# Patient Record
Sex: Female | Born: 2000 | Race: White | Hispanic: No | Marital: Single | Smoking: Never smoker
Health system: Southern US, Community
[De-identification: ages and names within clinical notes are randomized; demographics above are authoritative.]

## PROBLEM LIST (undated history)

## (undated) DIAGNOSIS — IMO0001 Reserved for inherently not codable concepts without codable children: Secondary | ICD-10-CM

## (undated) HISTORY — PX: NO PAST SURGERIES: SHX2092

## (undated) HISTORY — DX: Reserved for inherently not codable concepts without codable children: IMO0001

---
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
## 2019-04-18 ENCOUNTER — Other Ambulatory Visit: Payer: BLUE CROSS/BLUE SHIELD

## 2019-04-25 ENCOUNTER — Encounter: Payer: Self-pay | Admitting: Nurse Practitioner

## 2019-04-25 ENCOUNTER — Encounter: Payer: BLUE CROSS/BLUE SHIELD | Admitting: Nurse Practitioner

## 2019-04-25 ENCOUNTER — Other Ambulatory Visit: Payer: Self-pay

## 2019-04-25 ENCOUNTER — Ambulatory Visit (INDEPENDENT_AMBULATORY_CARE_PROVIDER_SITE_OTHER): Payer: BLUE CROSS/BLUE SHIELD | Admitting: Nurse Practitioner

## 2019-04-25 VITALS — BP 108/63 | HR 92 | Ht 64.55 in | Wt 123.0 lb

## 2019-04-25 DIAGNOSIS — Z0000 Encounter for general adult medical examination without abnormal findings: Secondary | ICD-10-CM

## 2019-04-25 NOTE — Patient Instructions (Addendum)
Olivia Ortiz,   Thank you for coming in to clinic today.  1. Continue an active lifestyle  2. You will be due for [place].  This means you should eat no food or drink after midnight.  Drink only water or coffee without cream/sugar on the morning of your lab visit. - Please get your labs collected at a [place] for lab draw in the next 7 days. - Your results will be available about 2-3 days after blood draw.  If you have set up a MyChart account, you can can log in to MyChart online to view your results and a brief explanation. Also, we can discuss your results together at your next office visit if you would like.   Please schedule a follow-up appointment with Cassell Smiles, AGNP. Return in about 1 year (around 04/24/2020) for annual physical.  If you have any other questions or concerns, please feel free to call the clinic or send a message through [place]. You may also schedule an earlier appointment if necessary.  You will receive a survey after today's visit either digitally by e-mail or paper by [employer]. Your experiences and feedback matter to [place].  Please respond so we know how we are doing as we provide care for you.   Cassell Smiles, DNP, AGNP-BC Adult Gerontology Nurse Practitioner [place]

## 2019-04-25 NOTE — Progress Notes (Signed)
Subjective:    Patient ID: Olivia BachelorBriana Ballinger, female    DOB: 05-Jan-2001, 18 y.o.   MRN: [id number]  Olivia Ortiz is a 18 y.o. female presenting on 04/25/2019 for Annual Exam  HPI Annual Physical Exam Patient has been feeling well.  They have no acute concerns today. Sleeps 10-12 hours per night uninterrupted.  Patient was around 5-6 in past.  Now patient states she always feels tired.   HEALTH MAINTENANCE: Weight/BMI: healthy Physical activity: regular with work, swimming Diet: regular Seatbelt: always Sunscreen: regularly PAP: n/a HIV: neg Optometry: not regularly Dentistry: regularly  VACCINES: Tetanus: up to date Influenza: recommended  Depression screen [facility] 2/9 04/25/2019 04/19/2018  Decreased Interest 0 0  Down, Depressed, Hopeless 0 1  PHQ - 2 Score 0 1  Altered sleeping - 3  Tired, decreased energy - 2  Change in appetite - 0  Feeling bad or failure about yourself  - 0  Trouble concentrating - 0  Moving slowly or fidgety/restless - 0  Suicidal thoughts - 0  PHQ-9 Score - 6  Difficult doing work/chores - Not difficult at all     Past Medical History:  Diagnosis Date  . Healthy adolescent    Past Surgical History:  Procedure Laterality Date  . NO PAST SURGERIES     Social History   Socioeconomic History  . Marital status: Single    Spouse name: Not on file  . Number of children: Not on file  . Years of education: Not on file  . Highest education level: Not on file  Occupational History  . Occupation: Consulting civil engineerstudent  Social Needs  . Financial resource strain: Not on file  . Food insecurity    Worry: Not on file    Inability: Not on file  . Transportation needs    Medical: Not on file    Non-medical: Not on file  Tobacco Use  . Smoking status: Never Smoker  . Smokeless tobacco: Never Used  Substance and Sexual Activity  . Alcohol use: No    Frequency: Never  . Drug use: No  . Sexual activity: Not on file  Lifestyle  . Physical activity    Days per  week: Not on file    Minutes per session: Not on file  . Stress: Not on file  Relationships  . Social Musicianconnections    Talks on phone: Not on file    Gets together: Not on file    Attends religious service: Not on file    Active member of club or organization: Not on file    Attends meetings of clubs or organizations: Not on file    Relationship status: Not on file  . Intimate partner violence    Fear of current or ex partner: Not on file    Emotionally abused: Not on file    Physically abused: Not on file    Forced sexual activity: Not on file  Other Topics Concern  . Not on file  Social History Narrative  . Not on file   Family History  Problem Relation Age of Onset  . Healthy Mother   . Healthy Father   . Cancer Paternal Grandmother   . Breast cancer Paternal Grandmother   . Healthy Brother   . Diabetes Maternal Grandfather   . CAD Maternal Grandfather   . Cancer Paternal Grandfather        skin  . Stroke Neg Hx    Current Outpatient Medications on File Prior to Visit  Medication Sig  .  medroxyPROGESTERone (DEPO-PROVERA) 150 MG/ML injection Inject 1 mL (150 mg total) into the muscle every 3 (three) months. (Patient not taking: Reported on 04/25/2019)   No current facility-administered medications on file prior to visit.     Review of Systems  Constitutional: Negative for chills and fever.  HENT: Negative for congestion and sore throat.   Eyes: Negative for pain.  Respiratory: Negative for cough, shortness of breath and wheezing.   Cardiovascular: Negative for chest pain, palpitations and leg swelling.  Gastrointestinal: Negative for abdominal pain, blood in stool, constipation, diarrhea, nausea and vomiting.  Endocrine: Negative for polydipsia.  Genitourinary: Negative for dysuria, frequency, hematuria and urgency.  Musculoskeletal: Negative for back pain, myalgias and neck pain.  Skin: Negative for rash.  Allergic/Immunologic: Negative for environmental allergies.   Neurological: Negative for dizziness, weakness and headaches.  Hematological: Does not bruise/bleed easily.  Psychiatric/Behavioral: Negative for dysphoric mood and suicidal ideas. The patient is not nervous/anxious.    Per HPI unless specifically indicated above.     Objective:    BP 108/63 (BP Location: Right Arm, Patient Position: Sitting, Cuff Size: Small)   Pulse 92   Ht 5' 4.55" (1.64 m)   Wt 123 lb (55.8 kg)   LMP 04/04/2019   BMI 20.75 kg/m   Wt Readings from Last 3 Encounters:  04/25/19 123 lb (55.8 kg) (42 %, Z= -0.21)*  04/19/18 108 lb 12.8 oz (49.4 kg) (17 %, Z= -0.96)*  10/20/17 121 lb 6.4 oz (55.1 kg) (46 %, Z= -0.10)*   * Growth percentiles are based on CDC (Girls, 2-20 Years) data.    Physical Exam Vitals signs and nursing note reviewed.  Constitutional:      General: She is not in acute distress.    Appearance: Normal appearance. She is well-developed and normal weight.  HENT:     Head: Normocephalic and atraumatic.     Right Ear: Tympanic membrane, ear canal and external ear normal.     Left Ear: Tympanic membrane, ear canal and external ear normal.     Nose: Nose normal.     Mouth/Throat:     Mouth: Mucous membranes are moist.  Eyes:     Conjunctiva/sclera: Conjunctivae normal.     Pupils: Pupils are equal, round, and reactive to light.  Neck:     Musculoskeletal: Normal range of motion and neck supple.     Thyroid: No thyromegaly.     Vascular: No JVD.     Trachea: No tracheal deviation.  Cardiovascular:     Rate and Rhythm: Normal rate and regular rhythm.     Heart sounds: Normal heart sounds. No murmur. No friction rub. No gallop.   Pulmonary:     Effort: Pulmonary effort is normal. No respiratory distress.     Breath sounds: Normal breath sounds.  Abdominal:     General: Bowel sounds are normal. There is no distension.     Palpations: Abdomen is soft.     Tenderness: There is no abdominal tenderness.  Musculoskeletal: Normal range of  motion.  Lymphadenopathy:     Cervical: No cervical adenopathy.  Skin:    General: Skin is warm and dry.     Capillary Refill: Capillary refill takes less than 2 seconds.  Neurological:     General: No focal deficit present.     Mental Status: She is alert and oriented to person, place, and time. Mental status is at baseline.     Cranial Nerves: No cranial nerve deficit.  Psychiatric:  Mood and Affect: Mood normal.        Behavior: Behavior normal.        Thought Content: Thought content normal.        Judgment: Judgment normal.     Results for orders placed or performed in visit on 07/05/18  POCT urine pregnancy  Result Value Ref Range   Preg Test, Ur Negative Negative      Assessment & Plan:   Problem List Items Addressed This Visit    None    Visit Diagnoses    Encounter for annual physical exam    -  Primary   Relevant Orders   CBC with Differential/Platelet   TSH   Comprehensive Metabolic Panel (CMET)   Hemoglobin A1c     Annual physical exam without new findings.  Well adult with no acute concerns.  Not currently sexually active and not needing contraception discussion today.  Plan: 1. Obtain health maintenance screenings as above according to age. - Increase physical activity to 30 minutes most days of the week.  - Eat healthy diet high in vegetables and fruits; low in refined carbohydrates. - Screening labs and tests as ordered 2. Encouraged patient to seek contraception as soon as she may become sexually active, use barrier methods if not on hormonal method. 3. Return 1 year for annual physical.   Follow up plan: Return in about 1 year (around 04/24/2020) for annual physical.  Cassell Smiles, DNP, AGPCNP-BC Adult Gerontology Primary Care Nurse Practitioner [facility] 04/25/2019, 2:35 PM

## 2019-05-17 ENCOUNTER — Emergency Department
Admission: EM | Admit: 2019-05-17 | Discharge: 2019-05-17 | Disposition: A | Discharge status: Home / Self Care | Payer: No Typology Code available for payment source | Attending: Emergency Medicine | Admitting: Emergency Medicine

## 2019-05-17 ENCOUNTER — Emergency Department: Payer: No Typology Code available for payment source

## 2019-05-17 ENCOUNTER — Other Ambulatory Visit: Payer: Self-pay

## 2019-05-17 ENCOUNTER — Encounter: Payer: Self-pay | Admitting: Emergency Medicine

## 2019-05-17 DIAGNOSIS — R1011 Right upper quadrant pain: Secondary | ICD-10-CM | POA: Diagnosis present

## 2019-05-17 DIAGNOSIS — K8020 Calculus of gallbladder without cholecystitis without obstruction: Secondary | ICD-10-CM | POA: Diagnosis not present

## 2019-05-17 LAB — COMPREHENSIVE METABOLIC PANEL
ALT: 133 U/L — ABNORMAL HIGH (ref 0–44)
AST: 277 U/L — ABNORMAL HIGH (ref 15–41)
Albumin: 4.6 g/dL (ref 3.5–5.0)
Alkaline Phosphatase: 64 U/L (ref 38–126)
Anion gap: 9 (ref 5–15)
BUN: 9 mg/dL (ref 6–20)
CO2: 22 mmol/L (ref 22–32)
Calcium: 9.4 mg/dL (ref 8.9–10.3)
Chloride: 109 mmol/L (ref 98–111)
Creatinine, Ser: 0.56 mg/dL (ref 0.44–1.00)
GFR calc Af Amer: >60 mL/min (ref >60)
GFR calc non Af Amer: >60 mL/min (ref >60)
Glucose, Bld: 96 mg/dL (ref 70–99)
Potassium: 3.9 mmol/L (ref 3.5–5.1)
Sodium: 140 mmol/L (ref 135–145)
Total Bilirubin: 1.2 mg/dL (ref 0.3–1.2)
Total Protein: 7.3 g/dL (ref 6.5–8.1)

## 2019-05-17 LAB — CBC
HCT: 39.9 % (ref 36.0–46.0)
Hemoglobin: 13.7 g/dL (ref 12.0–15.0)
MCH: 29 pg (ref 26.0–34.0)
MCHC: 34.3 g/dL (ref 30.0–36.0)
MCV: 84.5 fL (ref 80.0–100.0)
Platelets: 264 10*3/uL (ref 150–400)
RBC: 4.72 MIL/uL (ref 3.87–5.11)
RDW: 13.4 % (ref 11.5–15.5)
WBC: 9.7 10*3/uL (ref 4.0–10.5)
nRBC: 0 % (ref 0.0–0.2)

## 2019-05-17 LAB — URINALYSIS, COMPLETE (UACMP) WITH MICROSCOPIC
Bilirubin Urine: NEGATIVE
Glucose, UA: NEGATIVE mg/dL
Hgb urine dipstick: NEGATIVE
Ketones, ur: NEGATIVE mg/dL
Leukocytes,Ua: NEGATIVE
Nitrite: NEGATIVE
Protein, ur: NEGATIVE mg/dL
Specific Gravity, Urine: 1.005 (ref 1.005–1.030)
pH: 6 (ref 5.0–8.0)

## 2019-05-17 LAB — ETHANOL: Alcohol, Ethyl (B): <10 mg/dL (ref <10)

## 2019-05-17 LAB — POCT PREGNANCY, URINE: Preg Test, Ur: NEGATIVE

## 2019-05-17 LAB — LIPASE, BLOOD: Lipase: 31 U/L (ref 11–51)

## 2019-05-17 MED ORDER — IBUPROFEN 600 MG PO TABS: 600.0000 mg | ORAL_TABLET | Freq: Three times a day (TID) | ORAL | 0 refills | Status: DC | PRN

## 2019-05-17 MED ORDER — ONDANSETRON HCL 4 MG/2ML IJ SOLN
4.0000 mg | Freq: Once | INTRAMUSCULAR | Status: AC
  Administered 2019-05-17: 4 mg via INTRAVENOUS
  Filled 2019-05-17: qty 2

## 2019-05-17 MED ORDER — MORPHINE SULFATE (PF) 4 MG/ML IV SOLN
4.0000 mg | Freq: Once | INTRAVENOUS | Status: AC
  Administered 2019-05-17: 4 mg via INTRAVENOUS
  Filled 2019-05-17: qty 1

## 2019-05-17 MED ORDER — HYDROCODONE-ACETAMINOPHEN 5-325 MG PO TABS
1.0000 | ORAL_TABLET | Freq: Once | ORAL | Status: AC
  Administered 2019-05-17: 1 via ORAL
  Filled 2019-05-17: qty 1

## 2019-05-17 MED ORDER — SODIUM CHLORIDE 0.9 % IV BOLUS
1000.0000 mL | Freq: Once | INTRAVENOUS | Status: AC
  Administered 2019-05-17: 13:00:00 1000 mL via INTRAVENOUS

## 2019-05-17 MED ORDER — OXYCODONE HCL 5 MG PO TABS: 5.0000 mg | ORAL_TABLET | Freq: Three times a day (TID) | ORAL | 0 refills | Status: DC | PRN

## 2019-05-17 MED ORDER — ONDANSETRON 4 MG PO TBDP: 4.0000 mg | ORAL_TABLET | Freq: Three times a day (TID) | ORAL | 0 refills | Status: DC | PRN

## 2019-05-17 NOTE — ED Triage Notes (Signed)
Pt reports intermittent pain in her RUQ since early this am and now with right shoulder and neck pain. Pt states pain in abd is intermittent but pain in neck and shoulder is constant. Pt denies NVD, SOB or urinary sx's.

## 2019-05-17 NOTE — ED Notes (Signed)
Pt d/c instructions reveiwed. Pt told to not operate a motor vehicle on prescription. Pt is ambulatory. Pt is leaving with mother.

## 2019-05-17 NOTE — ED Notes (Signed)
Returned from U/S

## 2019-05-17 NOTE — ED Provider Notes (Signed)
[facility] Emergency Department Provider Note  ____________________________________________   First MD Initiated Contact with Patient 05/17/19 1201     (approximate)  I have reviewed the triage vital signs and the nursing notes.   HISTORY  Chief Complaint Abdominal Pain, Torticollis, and Shoulder Pain    HPI Olivia Ortiz is a 18 y.o. female here with right upper quadrant abdominal pain.  The patient states she awoke around 2 AM this morning.  She states she developed acute, aching, gnawing, right upper quadrant abdominal pain.  She was able to go back to sleep but when she awoke and started moving, the pain radiated up towards her right upper quadrant and right shoulder blade.  She describes it as an aching, stabbing, sharp pain. No acute ischemic changes.  The pain is worse with eating and palpation.  No alleviating factors.  She had associated nausea but no vomiting.  No diarrhea.  No history of similar symptoms.  No cough.  No known fevers.  No urinary symptoms.  No h/o ovarian cysts.       Past Medical History:  Diagnosis Date  . Healthy adolescent     There are no active problems to display for this patient.   Past Surgical History:  Procedure Laterality Date  . NO PAST SURGERIES      Prior to Admission medications   Medication Sig Start Date End Date Taking? Authorizing Provider  ibuprofen (ADVIL) 600 MG tablet Take 1 tablet (600 mg total) by mouth every 8 (eight) hours as needed for moderate pain. 05/17/19   Duffy Bruce, MD  ondansetron (ZOFRAN ODT) 4 MG disintegrating tablet Take 1 tablet (4 mg total) by mouth every 8 (eight) hours as needed for nausea or vomiting. 05/17/19   Duffy Bruce, MD  oxyCODONE (ROXICODONE) 5 MG immediate release tablet Take 1 tablet (5 mg total) by mouth every 8 (eight) hours as needed for severe pain. 05/17/19 05/16/20  Duffy Bruce, MD    Allergies Patient has no known allergies.  Family History  Problem  Relation Age of Onset  . Healthy Mother   . Healthy Father   . Cancer Paternal Grandmother   . Breast cancer Paternal Grandmother   . Healthy Brother   . Diabetes Maternal Grandfather   . CAD Maternal Grandfather   . Cancer Paternal Grandfather        skin  . Stroke Neg Hx     Social History Social History   Tobacco Use  . Smoking status: Never Smoker  . Smokeless tobacco: Never Used  Substance Use Topics  . Alcohol use: No    Frequency: Never  . Drug use: No    Review of Systems  Review of Systems  Constitutional: Positive for fatigue. Negative for fever.  HENT: Negative for congestion and sore throat.   Eyes: Negative for visual disturbance.  Respiratory: Negative for cough and shortness of breath.   Cardiovascular: Negative for chest pain.  Gastrointestinal: Positive for abdominal pain and nausea. Negative for diarrhea and vomiting.  Genitourinary: Negative for flank pain.  Musculoskeletal: Negative for back pain and neck pain.  Skin: Negative for rash and wound.  Neurological: Negative for weakness.  All other systems reviewed and are negative.    ____________________________________________  PHYSICAL EXAM:      VITAL SIGNS: ED Triage Vitals  Enc Vitals Group     BP 05/17/19 1145 106/70     Pulse --      Resp --  Temp --      Temp src --      SpO2 --      Weight 05/17/19 1043 120 lb (54.4 kg)     Height 05/17/19 1043 5\' 4"  (1.626 m)     Head Circumference --      Peak Flow --      Pain Score 05/17/19 1043 7     Pain Loc --      Pain Edu? --      Excl. in GC? --      Physical Exam Vitals signs and nursing note reviewed.  Constitutional:      General: She is not in acute distress.    Appearance: She is well-developed.  HENT:     Head: Normocephalic and atraumatic.  Eyes:     Conjunctiva/sclera: Conjunctivae normal.  Neck:     Musculoskeletal: Neck supple.  Cardiovascular:     Rate and Rhythm: Normal rate and regular rhythm.     Heart  sounds: Normal heart sounds. No murmur. No friction rub.  Pulmonary:     Effort: Pulmonary effort is normal. No respiratory distress.     Breath sounds: Normal breath sounds. No wheezing or rales.  Abdominal:     General: There is no distension.     Palpations: Abdomen is soft.     Tenderness: There is abdominal tenderness in the right upper quadrant and epigastric area. There is guarding. There is no right CVA tenderness, left CVA tenderness or rebound. Positive signs include Murphy's sign. Negative signs include Rovsing's sign.  Skin:    General: Skin is warm.     Capillary Refill: Capillary refill takes less than 2 seconds.  Neurological:     Mental Status: She is alert and oriented to person, place, and time.     Motor: No abnormal muscle tone.       ____________________________________________   LABS (all labs ordered are listed, but only abnormal results are displayed)  Labs Reviewed  COMPREHENSIVE METABOLIC PANEL - Abnormal; Notable for the following components:      Result Value   AST 277 (*)    ALT 133 (*)    All other components within normal limits  URINALYSIS, COMPLETE (UACMP) WITH MICROSCOPIC - Abnormal; Notable for the following components:   Color, Urine STRAW (*)    APPearance CLEAR (*)    Bacteria, UA RARE (*)    All other components within normal limits  LIPASE, BLOOD  CBC  ETHANOL  POC URINE PREG, ED  POCT PREGNANCY, URINE    ____________________________________________  EKG: None ________________________________________  RADIOLOGY All imaging, including plain films, CT scans, and ultrasounds, independently reviewed by me, and interpretations confirmed via formal radiology reads.  ED MD interpretation:   U/S: Gallstones, no acute cholecystitis  Official radiology report(s): US Abdomen Limited Ruq  Result Date: 05/17/2019 CLINICAL DATA:  Right upper quadrant pain for 1 day EXAM: ULTRASOUND ABDOMEN LIMITED RIGHT UPPER QUADRANT COMPARISON:  None.  FINDINGS: Gallbladder: There are several small gallstones measuring up to 6 mm. No gallbladder wall thickening. No pericholecystic fluid. No sonographic Murphy sign noted by sonographer. Common bile duct: Diameter: 2.0 mm Liver: No focal lesion identified. Within normal limits in parenchymal echogenicity. Portal vein is patent on color Doppler imaging with normal direction of blood flow towards the liver. Other: None. IMPRESSION: Cholelithiasis without other evidence of cholecystitis. Electronically Signed   By: Emmaline Kluver M.D.   On: 05/17/2019 13:29    ____________________________________________  PROCEDURES  Procedure(s) performed (including Critical Care):  Procedures  ____________________________________________  INITIAL IMPRESSION / MDM / ASSESSMENT AND PLAN / ED COURSE  As part of my medical decision making, I reviewed the following data within the electronic MEDICAL RECORD NUMBER Notes from prior ED visits and [place] Controlled Substance Database      *Abelina BachelorBriana Fullard was evaluated in Emergency Department on 05/17/2019 for the symptoms described in the history of present illness. She was evaluated in the context of the global COVID-19 pandemic, which necessitated consideration that the patient might be at risk for infection with the SARS-CoV-2 virus that causes COVID-19. Institutional protocols and algorithms that pertain to the evaluation of patients at risk for COVID-19 are in a state of rapid change based on information released by regulatory bodies including the CDC and federal and state organizations. These policies and algorithms were followed during the patient's care in the ED.  Some ED evaluations and interventions may be delayed as a result of limited staffing during the pandemic.*   Clinical Course as of May 17 1547  Tue May 17, 2019  1349 18 yo F here with RUQ pain. Suspect symptomatic choledocholithiasis versus early cholecystitis. Normal WBC, no signs of significant systemic  illness or infection. AST/ALT elevated but bili is WNL. Pain improving. U/S is c/w acute chole. Lipase normal, CBD normal, no signs of cholangitis or choledocholithiasis. UPT pending, Dr. Tonna BoehringerSakai consulted.   [CI]  1430 Dr. Tonna BoehringerSakai has seen. Pt pain markedly improved. Will PO challenge, d/c with outpt follow-up and good return precautions.   [CI]    Clinical Course User Index [CI] Shaune PollackIsaacs, Sorrel Cassetta, MD    Medical Decision Making: As above  ____________________________________________  FINAL CLINICAL IMPRESSION(S) / ED DIAGNOSES  Final diagnoses:  RUQ pain  Gallstones     MEDICATIONS GIVEN DURING THIS VISIT:  Medications  morphine 4 MG/ML injection 4 mg (4 mg Intravenous Given 05/17/19 1252)  ondansetron (ZOFRAN) injection 4 mg (4 mg Intravenous Given 05/17/19 1252)  sodium chloride 0.9 % bolus 1,000 mL (0 mLs Intravenous Stopped 05/17/19 1441)  HYDROcodone-acetaminophen (NORCO/VICODIN) 5-325 MG per tablet 1 tablet (1 tablet Oral Given 05/17/19 1446)     ED Discharge Orders         Ordered    oxyCODONE (ROXICODONE) 5 MG immediate release tablet  Every 8 hours PRN     05/17/19 1433    ibuprofen (ADVIL) 600 MG tablet  Every 8 hours PRN     05/17/19 1433    ondansetron (ZOFRAN ODT) 4 MG disintegrating tablet  Every 8 hours PRN     05/17/19 1433           Note:  This document was prepared using Dragon voice recognition software and may include unintentional dictation errors.   Shaune PollackIsaacs, Elgie Maziarz, MD 05/17/19 [phone]

## 2019-05-17 NOTE — ED Notes (Signed)
Pt states pain started have RUQ pain during the night that radiated to her shoulder. Side lying makes stomach pain worse. Pt can flex and extend neck w/o pain getting worse. Denies alcohol consumption. Pt states she has felt nauseas but denies vomiting. Pt states pain is 4/10.

## 2019-05-17 NOTE — Consult Note (Signed)
Subjective:   CC: Biliary colic, elevated LFTs  HPI:  Olivia Ortiz is a 18 y.o. female who is consulted by Ellender Hose for evaluation of above cc.  Symptoms were first noted 1 days ago. Pain is sharp, localized to the right upper quadrant, nonradiating.  Associated with nothing specific, exacerbated by nothing specific.  Work-up in the ED noted elevated LFTs, as well as cholelithiasis without any signs of acute cholecystitis.  Currently patient states the pain has much improved since admission. Past Medical History: None reported  Past Surgical History: None reported  Family History: family history includes Breast cancer in her paternal grandmother; CAD in her maternal grandfather; Cancer in her paternal grandfather and paternal grandmother; Diabetes in her maternal grandfather; Healthy in her brother, father, and mother.  Social History:  reports that she has never smoked. She has never used smokeless tobacco. She reports that she does not drink alcohol or use drugs.  Current Medications: None reported  Allergies:  Allergies as of 05/17/2019  . (No Known Allergies)    ROS:  General: Denies weight loss, weight gain, fatigue, fevers, chills, and night sweats. Eyes: Denies blurry vision, double vision, eye pain, itchy eyes, and tearing. Ears: Denies hearing loss, earache, and ringing in ears. Nose: Denies sinus pain, congestion, infections, runny nose, and nosebleeds. Mouth/throat: Denies hoarseness, sore throat, bleeding gums, and difficulty swallowing. Heart: Denies chest pain, palpitations, racing heart, irregular heartbeat, leg pain or swelling, and decreased activity tolerance. Respiratory: Denies breathing difficulty, shortness of breath, wheezing, cough, and sputum. GI: Denies change in appetite, heartburn, nausea, vomiting, constipation, diarrhea, and blood in stool. GU: Denies difficulty urinating, pain with urinating, urgency, frequency, blood in urine. Musculoskeletal: Denies  joint stiffness, pain, swelling, muscle weakness. Skin: Denies rash, itching, mass, tumors, sores, and boils Neurologic: Denies headache, fainting, dizziness, seizures, numbness, and tingling. Psychiatric: Denies depression, anxiety, difficulty sleeping, and memory loss. Endocrine: Denies heat or cold intolerance, and increased thirst or urination. Blood/lymph: Denies easy bruising, easy bruising, and swollen glands     Objective:     BP (!) 100/44 (BP Location: Right Arm)   Pulse 80   Temp 98.1 F (36.7 C) (Oral)   Resp 16   Ht 5\' 4"  (1.626 m)   Wt 54.4 kg   SpO2 100%   BMI 20.60 kg/m    Constitutional :  alert, cooperative, appears stated age and no distress  Lymphatics/Throat:  no asymmetry, masses, or scars  Respiratory:  clear to auscultation bilaterally  Cardiovascular:  regular rate and rhythm  Gastrointestinal: soft, non-tender; bowel sounds normal; no masses,  no organomegaly.   Musculoskeletal: Steady movement  Skin: Cool and moist  Psychiatric: Normal affect, non-agitated, not confused       LABS:  CMP Latest Ref Rng & Units 05/17/2019 04/20/2018  Glucose 70 - 99 mg/dL 96 94  BUN 6 - 20 mg/dL 9 9  Creatinine 0.44 - 1.00 mg/dL 0.56 0.88  Sodium 135 - 145 mmol/L 140 139  Potassium 3.5 - 5.1 mmol/L 3.9 4.2  Chloride 98 - 111 mmol/L 109 107  CO2 22 - 32 mmol/L 22 24  Calcium 8.9 - 10.3 mg/dL 9.4 9.4  Total Protein 6.5 - 8.1 g/dL 7.3 6.5  Total Bilirubin 0.3 - 1.2 mg/dL 1.2 0.5  Alkaline Phos 38 - 126 U/L 64 -  AST 15 - 41 U/L 277(H) 11(L)  ALT 0 - 44 U/L 133(H) 6   CBC Latest Ref Rng & Units 05/17/2019 04/20/2018  WBC 4.0 - 10.5  K/uL 9.7 7.9  Hemoglobin 12.0 - 15.0 g/dL 16.113.7 09.613.7  Hematocrit 04.536.0 - 46.0 % 39.9 42.0  Platelets 150 - 400 K/uL 264 244     RADS: CLINICAL DATA:  Right upper quadrant pain for 1 day  EXAM: ULTRASOUND ABDOMEN LIMITED RIGHT UPPER QUADRANT  COMPARISON:  None.  FINDINGS: Gallbladder:  There are several small gallstones  measuring up to 6 mm. No gallbladder wall thickening. No pericholecystic fluid. No sonographic Murphy sign noted by sonographer.  Common bile duct:  Diameter: 2.0 mm  Liver:  No focal lesion identified. Within normal limits in parenchymal echogenicity. Portal vein is patent on color Doppler imaging with normal direction of blood flow towards the liver.  Other: None.  IMPRESSION: Cholelithiasis without other evidence of cholecystitis. Assessment:      Cholelithiasis, elevated LFTs likely to episode of biliary colic.  Patient symptoms have improved and she shows no sign of acute cholecystitis on imaging with a normal WBC.  Plan:     Patient is requesting to go home at this time due to an upcoming vacation.  With no signs of acute cholecystitis on any objective findings and with the pain improving at this time, patient is deemed appropriate for discharge and close outpatient follow-up.  Strict ED return precautions given and dietary restrictions discussed to minimize another biliary colic prior to return home from her vacation.  If she does not have any further issues while on vacation, I asked her to do to make an appointment with our clinic to ensure that she has appropriate follow-up for eventual lap chole, which she is agreeable to.  We will discuss the details of the risk benefits alternatives to procedure at her next follow-up appointment.  Patient verbalized understanding and she is agreement with plan along with the ED provider.

## 2019-05-17 NOTE — ED Notes (Signed)
Pt highly encouraged for urine sample.

## 2019-06-01 ENCOUNTER — Ambulatory Visit: Payer: Self-pay | Admitting: Surgery

## 2019-06-01 NOTE — H&P (View-Only) (Signed)
Subjective:   CC: Biliary colic [K80.50]  HPI:  Olivia Ortiz is a 18 y.o. female who returns for evaluation of above CC. Nightly pain continues, but manageable with meds just at night.  She is able to keep some food down.   Past Medical History: none reported  Past Surgical History: none reported  Family History: reviewed and not relevant to CC  Social History:  reports that she has never smoked. She has never used smokeless tobacco. No history on file for alcohol and drug.  Current Medications: has a current medication list which includes the following prescription(s): codeine-guaifenesin, ibuprofen, ondansetron, and oxycodone.  Allergies:  Allergies as of 06/01/2019  . (No Known Allergies)    ROS:  A 15 point review of systems was performed and pertinent positives and negatives noted in HPI    Objective:     BP 115/69   Pulse 104   Ht 162.6 cm (5' 4")   Wt 60.3 kg (133 lb)   BMI 22.83 kg/m    Constitutional :  alert, appears stated age, cooperative and no distress  Lymphatics/Throat:  no asymmetry, masses, or scars  Respiratory:  clear to auscultation bilaterally  Cardiovascular:  regular rate and rhythm  Gastrointestinal: soft, non-tender; bowel sounds normal; no masses,  no organomegaly.    Musculoskeletal: Steady gait and movement  Skin: Cool and moist  Psychiatric: Normal affect, non-agitated, not confused       LABS:  Component Name 05/17/2019 04/20/2018   96 94  9 9   0.88   96   111   NOT APPLICABLE  140 139  3.9 4.2  109 107  22 24  9.4 9.4  7.3 6.5   2.3  1.2 0.5  277 (H) 11 (L)  133 (H) 6  31   0.56   4.6   64   >60   >60   9   Glucose, Bld  BUN  Creat  GFR, Est Non African American  GFR, Est African American  BUN/Creatinine Ratio  Sodium  Potassium  Chloride  CO2  Calcium  Total Protein  AG Ratio  Total Bilirubin  AST  ALT  Lipase  Creatinine, Ser  Albumin  Alkaline Phosphatase  GFR calc non Af Amer   GFR calc Af Amer  Anion gap      RADS: CLINICAL DATA: Right upper quadrant pain for 1 day  EXAM: ULTRASOUND ABDOMEN LIMITED RIGHT UPPER QUADRANT  COMPARISON: None.  FINDINGS: Gallbladder:  There are several small gallstones measuring up to 6 mm. No gallbladder wall thickening. No pericholecystic fluid. No sonographic Murphy sign noted by sonographer.  Common bile duct:  Diameter: 2.0 mm  Liver:  No focal lesion identified. Within normal limits in parenchymal echogenicity. Portal vein is patent on color Doppler imaging with normal direction of blood flow towards the liver.  Other: None.  IMPRESSION: Cholelithiasis without other evidence of cholecystitis.   Electronically Signed  By: Nancy Ballantyne M.D.  On: 05/17/2019 13:29  Other Result Information  Interface, Rad Results In - 05/17/2019  1:32 PM EDT CLINICAL DATA:  Right upper quadrant pain for 1 day  EXAM: ULTRASOUND ABDOMEN LIMITED RIGHT UPPER QUADRANT  COMPARISON:  None.  FINDINGS: Gallbladder:  There are several small gallstones measuring up to 6 mm. No gallbladder wall thickening. No pericholecystic fluid. No sonographic Murphy sign noted by sonographer.  Common bile duct:  Diameter: 2.0 mm  Liver:  No focal lesion identified. Within normal limits in parenchymal echogenicity. Portal vein   is patent on color Doppler imaging with normal direction of blood flow towards the liver.  Other: None.  IMPRESSION: Cholelithiasis without other evidence of cholecystitis.   Electronically Signed   By: Audie Pinto M.D.   On: 05/17/2019 13:29  Status    Assessment:      Biliary colic [G31.51]  Plan:     1. Biliary colic [V61.60].  Due to persistent pain at night, will proceed with lap chole, robot assisted.  Discussed the risk of surgery including post-op infxn, seroma, biloma, chronic pain, poor-delayed wound healing, retained gallstone, conversion to open procedure, post-op  SBO or ileus, and need for additional procedures to address said risks.  The risks of general anesthetic including MI, CVA, sudden death or even reaction to anesthetic medications also discussed. Alternatives include continued observation.  Benefits include possible symptom relief, prevention of complications including acute cholecystitis, pancreatitis.  Typical post operative recovery of 3-5 days rest, continued pain in area and incision sites, possible loose stools up to 4-6 weeks, also discussed.  ED return precautions given for sudden increase in RUQ pain, with possible accompanying fever, nausea, and/or vomiting.  The patient and mohter understands the risks, any and all questions were answered to the patient's satisfaction.  2. Patient has elected to proceed with surgical treatment. Procedure will be scheduled.  Written consent was obtained.

## 2019-06-01 NOTE — H&P (Signed)
Subjective:   CC: Biliary colic [K80.50]  HPI:  Olivia Ortiz is a 18 y.o. female who returns for evaluation of above CC. Nightly pain continues, but manageable with meds just at night.  She is able to keep some food down.   Past Medical History: none reported  Past Surgical History: none reported  Family History: reviewed and not relevant to CC  Social History:  reports that she has never smoked. She has never used smokeless tobacco. No history on file for alcohol and drug.  Current Medications: has a current medication list which includes the following prescription(s): codeine-guaifenesin, ibuprofen, ondansetron, and oxycodone.  Allergies:  Allergies as of 06/01/2019  . (No Known Allergies)    ROS:  A 15 point review of systems was performed and pertinent positives and negatives noted in HPI    Objective:     BP 115/69   Pulse 104   Ht 162.6 cm (5\' 4" )   Wt 60.3 kg (133 lb)   BMI 22.83 kg/m    Constitutional :  alert, appears stated age, cooperative and no distress  Lymphatics/Throat:  no asymmetry, masses, or scars  Respiratory:  clear to auscultation bilaterally  Cardiovascular:  regular rate and rhythm  Gastrointestinal: soft, non-tender; bowel sounds normal; no masses,  no organomegaly.    Musculoskeletal: Steady gait and movement  Skin: Cool and moist  Psychiatric: Normal affect, non-agitated, not confused       LABS:  Component Name 05/17/2019 04/20/2018   96 94  9 9   0.88   96   111   NOT APPLICABLE  140 139  3.9 4.2  109 107  22 24  9.4 9.4  7.3 6.5   2.3  1.2 0.5  277 (H) 11 (L)  133 (H) 6  31   0.56   4.6   64   >60   >60   9   Glucose, Bld  BUN  Creat  GFR, Est Non African American  GFR, Est African American  BUN/Creatinine Ratio  Sodium  Potassium  Chloride  CO2  Calcium  Total Protein  AG Ratio  Total Bilirubin  AST  ALT  Lipase  Creatinine, Ser  Albumin  Alkaline Phosphatase  GFR calc non Af Amer   GFR calc Af Amer  Anion gap      RADS: CLINICAL DATA: Right upper quadrant pain for 1 day  EXAM: ULTRASOUND ABDOMEN LIMITED RIGHT UPPER QUADRANT  COMPARISON: None.  FINDINGS: Gallbladder:  There are several small gallstones measuring up to 6 mm. No gallbladder wall thickening. No pericholecystic fluid. No sonographic Murphy sign noted by sonographer.  Common bile duct:  Diameter: 2.0 mm  Liver:  No focal lesion identified. Within normal limits in parenchymal echogenicity. Portal vein is patent on color Doppler imaging with normal direction of blood flow towards the liver.  Other: None.  IMPRESSION: Cholelithiasis without other evidence of cholecystitis.   Electronically Signed  By: Emmaline KluverNancy Ballantyne M.D.  On: 05/17/2019 13:29  Other Result Information  Interface, Rad Results In - 05/17/2019  1:32 PM EDT CLINICAL DATA:  Right upper quadrant pain for 1 day  EXAM: ULTRASOUND ABDOMEN LIMITED RIGHT UPPER QUADRANT  COMPARISON:  None.  FINDINGS: Gallbladder:  There are several small gallstones measuring up to 6 mm. No gallbladder wall thickening. No pericholecystic fluid. No sonographic Murphy sign noted by sonographer.  Common bile duct:  Diameter: 2.0 mm  Liver:  No focal lesion identified. Within normal limits in parenchymal echogenicity. Portal vein  is patent on color Doppler imaging with normal direction of blood flow towards the liver.  Other: None.  IMPRESSION: Cholelithiasis without other evidence of cholecystitis.   Electronically Signed   By: Audie Pinto M.D.   On: 05/17/2019 13:29  Status    Assessment:      Biliary colic [G31.51]  Plan:     1. Biliary colic [V61.60].  Due to persistent pain at night, will proceed with lap chole, robot assisted.  Discussed the risk of surgery including post-op infxn, seroma, biloma, chronic pain, poor-delayed wound healing, retained gallstone, conversion to open procedure, post-op  SBO or ileus, and need for additional procedures to address said risks.  The risks of general anesthetic including MI, CVA, sudden death or even reaction to anesthetic medications also discussed. Alternatives include continued observation.  Benefits include possible symptom relief, prevention of complications including acute cholecystitis, pancreatitis.  Typical post operative recovery of 3-5 days rest, continued pain in area and incision sites, possible loose stools up to 4-6 weeks, also discussed.  ED return precautions given for sudden increase in RUQ pain, with possible accompanying fever, nausea, and/or vomiting.  The patient and mohter understands the risks, any and all questions were answered to the patient's satisfaction.  2. Patient has elected to proceed with surgical treatment. Procedure will be scheduled.  Written consent was obtained.

## 2019-06-02 ENCOUNTER — Other Ambulatory Visit: Payer: Self-pay

## 2019-06-02 ENCOUNTER — Other Ambulatory Visit
Admission: RE | Admit: 2019-06-02 | Discharge: 2019-06-02 | Disposition: A | Discharge status: Home / Self Care | Payer: No Typology Code available for payment source | Source: Ambulatory Visit | Attending: Surgery | Admitting: Surgery

## 2019-06-02 ENCOUNTER — Inpatient Hospital Stay: Admission: RE | Admit: 2019-06-02 | Payer: No Typology Code available for payment source | Source: Ambulatory Visit

## 2019-06-02 DIAGNOSIS — Z01812 Encounter for preprocedural laboratory examination: Secondary | ICD-10-CM | POA: Diagnosis present

## 2019-06-02 DIAGNOSIS — Z20828 Contact with and (suspected) exposure to other viral communicable diseases: Secondary | ICD-10-CM | POA: Diagnosis not present

## 2019-06-02 LAB — SARS CORONAVIRUS 2 (TAT 6-24 HRS): SARS Coronavirus 2: NEGATIVE

## 2019-06-03 ENCOUNTER — Encounter
Admission: RE | Admit: 2019-06-03 | Discharge: 2019-06-03 | Disposition: A | Discharge status: Home / Self Care | Payer: No Typology Code available for payment source | Source: Ambulatory Visit | Attending: Surgery | Admitting: Surgery

## 2019-06-03 ENCOUNTER — Other Ambulatory Visit: Payer: Self-pay

## 2019-06-03 NOTE — Patient Instructions (Signed)
Your procedure is scheduled on: June 06, 2019 Report to Day Surgery on the 2nd floor of the [facility]. AT 11:30 AM    REMEMBER: Instructions that are not followed completely may result in serious medical risk, up to and including death; or upon the discretion of your surgeon and anesthesiologist your surgery may need to be rescheduled.  Do not eat food after midnight the night before surgery.  No gum chewing, lozengers or hard candies.  You may however, drink CLEAR liquids up to 2 hours before you are scheduled to arrive for your surgery. Do not drink anything within 2 hours of the start of your surgery.  Clear liquids include: - water  - apple juice without pulp -CLEAR  gatorade - black coffee or tea (Do NOT add milk or creamers to the coffee or tea) Do NOT drink anything that is not on this list.  No Alcohol for 24 hours before or after surgery.  No Smoking including e-cigarettes for 24 hours prior to surgery.  No chewable tobacco products for at least 6 hours prior to surgery.  No nicotine patches on the day of surgery.  On the morning of surgery brush your teeth with toothpaste and water, you may rinse your mouth with mouthwash if you wish Do not swallow any toothpaste or mouthwash.  Notify your doctor if there is any change in your medical condition (cold, fever, infection).  Do not wear jewelry, make-up, hairpins, clips or nail polish.  Do not wear lotions, powders, or perfumes.   Do not shave 48 hours prior to surgery.   Contacts and dentures may not be worn into surgery.  Do not bring valuables to the hospital, including drivers license, insurance or credit cards.   is not responsible for any belongings or valuables.   TAKE THESE MEDICATIONS THE MORNING OF SURGERY: NONE  Stop Anti-inflammatories (NSAIDS) such as Advil, Aleve, Ibuprofen, Motrin, Naproxen, Naprosyn and Aspirin based products such as Excedrin, Goodys Powder, BC Powder. (May take  Tylenol or Acetaminophen if needed.)  Stop ANY OVER THE COUNTER supplements until after surgery. (May continue Vitamin D, Vitamin B, and multivitamin.)  Wear comfortable clothing (specific to your surgery type) to the hospital.  Plan for stool softeners for home use.  If you are being discharged the day of surgery, you will not be allowed to drive home. You will need a responsible adult to drive you home and stay with you that night.   If you are taking public transportation, you will need to have a responsible adult with you. Please confirm with your physician that it is acceptable to use public transportation.   Please call [phone] if you have any questions about these instructions.

## 2019-06-05 MED ORDER — INDOCYANINE GREEN 25 MG IV SOLR
1.2500 mg | Freq: Once | INTRAVENOUS | Status: AC
  Administered 2019-06-06: 12:00:00 1.25 mg via INTRAVENOUS
  Filled 2019-06-05: qty 25

## 2019-06-06 ENCOUNTER — Ambulatory Visit: Payer: PRIVATE HEALTH INSURANCE | Admitting: Anesthesiology

## 2019-06-06 ENCOUNTER — Ambulatory Visit
Admission: RE | Admit: 2019-06-06 | Discharge: 2019-06-06 | Disposition: A | Discharge status: Home / Self Care | Payer: PRIVATE HEALTH INSURANCE | Attending: Surgery | Admitting: Surgery

## 2019-06-06 ENCOUNTER — Encounter: Payer: Self-pay | Admitting: *Deleted

## 2019-06-06 ENCOUNTER — Other Ambulatory Visit: Payer: Self-pay

## 2019-06-06 ENCOUNTER — Encounter
Admission: RE | Disposition: A | Discharge status: Home / Self Care | Payer: Self-pay | Source: Home / Self Care | Attending: Surgery

## 2019-06-06 DIAGNOSIS — K8050 Calculus of bile duct without cholangitis or cholecystitis without obstruction: Secondary | ICD-10-CM | POA: Diagnosis present

## 2019-06-06 DIAGNOSIS — Z79891 Long term (current) use of opiate analgesic: Secondary | ICD-10-CM | POA: Diagnosis not present

## 2019-06-06 DIAGNOSIS — K811 Chronic cholecystitis: Secondary | ICD-10-CM | POA: Diagnosis not present

## 2019-06-06 LAB — POCT PREGNANCY, URINE: Preg Test, Ur: NEGATIVE

## 2019-06-06 SURGERY — CHOLECYSTECTOMY, ROBOT-ASSISTED, LAPAROSCOPIC
Anesthesia: General

## 2019-06-06 MED ORDER — FENTANYL CITRATE (PF) 100 MCG/2ML IJ SOLN
INTRAMUSCULAR | Status: DC | PRN
  Administered 2019-06-06 (×2): 50 ug via INTRAVENOUS

## 2019-06-06 MED ORDER — MIDAZOLAM HCL 2 MG/2ML IJ SOLN
INTRAMUSCULAR | Status: DC | PRN
  Administered 2019-06-06: 2 mg via INTRAVENOUS

## 2019-06-06 MED ORDER — CHLORHEXIDINE GLUCONATE CLOTH 2 % EX PADS: 6.0000 | MEDICATED_PAD | Freq: Once | CUTANEOUS | Status: DC

## 2019-06-06 MED ORDER — FAMOTIDINE 20 MG PO TABS
ORAL_TABLET | ORAL | Status: AC
  Filled 2019-06-06: qty 1

## 2019-06-06 MED ORDER — IBUPROFEN 800 MG PO TABS: 800.0000 mg | ORAL_TABLET | Freq: Three times a day (TID) | ORAL | 0 refills | Status: DC | PRN

## 2019-06-06 MED ORDER — HYDROCODONE-ACETAMINOPHEN 5-325 MG PO TABS: 1.0000 | ORAL_TABLET | Freq: Four times a day (QID) | ORAL | 0 refills | Status: AC | PRN

## 2019-06-06 MED ORDER — SUGAMMADEX SODIUM 200 MG/2ML IV SOLN
INTRAVENOUS | Status: DC | PRN
  Administered 2019-06-06: 120 mg via INTRAVENOUS

## 2019-06-06 MED ORDER — BUPIVACAINE HCL (PF) 0.5 % IJ SOLN
INTRAMUSCULAR | Status: DC | PRN
  Administered 2019-06-06: 10 mL

## 2019-06-06 MED ORDER — OXYCODONE HCL 5 MG/5ML PO SOLN: 5.0000 mg | Freq: Once | ORAL | Status: DC | PRN

## 2019-06-06 MED ORDER — ONDANSETRON HCL 4 MG/2ML IJ SOLN
INTRAMUSCULAR | Status: AC
  Filled 2019-06-06: qty 2

## 2019-06-06 MED ORDER — CELECOXIB 200 MG PO CAPS
200.0000 mg | ORAL_CAPSULE | ORAL | Status: AC
  Administered 2019-06-06: 12:00:00 200 mg via ORAL

## 2019-06-06 MED ORDER — ROCURONIUM BROMIDE 50 MG/5ML IV SOLN
INTRAVENOUS | Status: AC
  Filled 2019-06-06: qty 1

## 2019-06-06 MED ORDER — CELECOXIB 200 MG PO CAPS
ORAL_CAPSULE | ORAL | Status: AC
  Filled 2019-06-06: qty 1

## 2019-06-06 MED ORDER — DOCUSATE SODIUM 100 MG PO CAPS: 100.0000 mg | ORAL_CAPSULE | Freq: Two times a day (BID) | ORAL | 0 refills | Status: AC | PRN

## 2019-06-06 MED ORDER — MIDAZOLAM HCL 2 MG/2ML IJ SOLN
INTRAMUSCULAR | Status: AC
  Filled 2019-06-06: qty 2

## 2019-06-06 MED ORDER — GABAPENTIN 300 MG PO CAPS
300.0000 mg | ORAL_CAPSULE | ORAL | Status: AC
  Administered 2019-06-06: 300 mg via ORAL

## 2019-06-06 MED ORDER — ACETAMINOPHEN 500 MG PO TABS
1000.0000 mg | ORAL_TABLET | ORAL | Status: AC
  Administered 2019-06-06: 12:00:00 1000 mg via ORAL

## 2019-06-06 MED ORDER — ONDANSETRON HCL 4 MG/2ML IJ SOLN
INTRAMUSCULAR | Status: DC | PRN
  Administered 2019-06-06: 4 mg via INTRAVENOUS

## 2019-06-06 MED ORDER — CEFAZOLIN SODIUM-DEXTROSE 2-4 GM/100ML-% IV SOLN
2.0000 g | INTRAVENOUS | Status: AC
  Administered 2019-06-06: 2 g via INTRAVENOUS

## 2019-06-06 MED ORDER — PROPOFOL 10 MG/ML IV BOLUS
INTRAVENOUS | Status: DC | PRN
  Administered 2019-06-06: 110 mg via INTRAVENOUS

## 2019-06-06 MED ORDER — OXYCODONE HCL 5 MG PO TABS: 5.0000 mg | ORAL_TABLET | Freq: Once | ORAL | Status: DC | PRN

## 2019-06-06 MED ORDER — LACTATED RINGERS IV SOLN
INTRAVENOUS | Status: DC
  Administered 2019-06-06 (×2): via INTRAVENOUS

## 2019-06-06 MED ORDER — FENTANYL CITRATE (PF) 100 MCG/2ML IJ SOLN
INTRAMUSCULAR | Status: AC
  Administered 2019-06-06: 25 ug via INTRAVENOUS
  Filled 2019-06-06: qty 2

## 2019-06-06 MED ORDER — BUPIVACAINE HCL (PF) 0.5 % IJ SOLN
INTRAMUSCULAR | Status: AC
  Filled 2019-06-06: qty 30

## 2019-06-06 MED ORDER — ROCURONIUM BROMIDE 100 MG/10ML IV SOLN
INTRAVENOUS | Status: DC | PRN
  Administered 2019-06-06: 50 mg via INTRAVENOUS
  Administered 2019-06-06: 10 mg via INTRAVENOUS

## 2019-06-06 MED ORDER — FENTANYL CITRATE (PF) 250 MCG/5ML IJ SOLN
INTRAMUSCULAR | Status: AC
  Filled 2019-06-06: qty 5

## 2019-06-06 MED ORDER — FENTANYL CITRATE (PF) 100 MCG/2ML IJ SOLN
25.0000 ug | INTRAMUSCULAR | Status: DC | PRN
  Administered 2019-06-06: 17:00:00 25 ug via INTRAVENOUS

## 2019-06-06 MED ORDER — FAMOTIDINE 20 MG PO TABS
20.0000 mg | ORAL_TABLET | Freq: Once | ORAL | Status: AC
  Administered 2019-06-06: 12:00:00 20 mg via ORAL

## 2019-06-06 MED ORDER — LIDOCAINE-EPINEPHRINE (PF) 1 %-1:200000 IJ SOLN
INTRAMUSCULAR | Status: AC
  Filled 2019-06-06: qty 30

## 2019-06-06 MED ORDER — GABAPENTIN 300 MG PO CAPS
ORAL_CAPSULE | ORAL | Status: AC
  Filled 2019-06-06: qty 1

## 2019-06-06 MED ORDER — ACETAMINOPHEN 325 MG PO TABS: 650.0000 mg | ORAL_TABLET | Freq: Three times a day (TID) | ORAL | 0 refills | Status: AC | PRN

## 2019-06-06 MED ORDER — DEXMEDETOMIDINE HCL 200 MCG/2ML IV SOLN
INTRAVENOUS | Status: DC | PRN
  Administered 2019-06-06: 12 ug via INTRAVENOUS

## 2019-06-06 MED ORDER — SUGAMMADEX SODIUM 200 MG/2ML IV SOLN
INTRAVENOUS | Status: AC
  Filled 2019-06-06: qty 4

## 2019-06-06 MED ORDER — LIDOCAINE HCL (CARDIAC) PF 100 MG/5ML IV SOSY
PREFILLED_SYRINGE | INTRAVENOUS | Status: DC | PRN
  Administered 2019-06-06: 70 mg via INTRAVENOUS

## 2019-06-06 MED ORDER — LIDOCAINE-EPINEPHRINE (PF) 1 %-1:200000 IJ SOLN
INTRAMUSCULAR | Status: DC | PRN
  Administered 2019-06-06: 10 mL

## 2019-06-06 MED ORDER — ACETAMINOPHEN 500 MG PO TABS
ORAL_TABLET | ORAL | Status: AC
  Administered 2019-06-06: 1000 mg via ORAL
  Filled 2019-06-06: qty 2

## 2019-06-06 MED ORDER — DEXMEDETOMIDINE HCL IN NACL 80 MCG/20ML IV SOLN
INTRAVENOUS | Status: AC
  Filled 2019-06-06: qty 20

## 2019-06-06 MED ORDER — DEXAMETHASONE SODIUM PHOSPHATE 10 MG/ML IJ SOLN
INTRAMUSCULAR | Status: DC | PRN
  Administered 2019-06-06: 5 mg via INTRAVENOUS

## 2019-06-06 MED ORDER — CEFAZOLIN SODIUM-DEXTROSE 2-4 GM/100ML-% IV SOLN
INTRAVENOUS | Status: AC
  Filled 2019-06-06: qty 100

## 2019-06-06 SURGICAL SUPPLY — 63 items
ADH SKN CLS APL DERMABOND .7 (GAUZE/BANDAGES/DRESSINGS) ×1
ANCHOR TIS RET SYS 235ML (MISCELLANEOUS) ×2 IMPLANT
APL PRP STRL LF DISP 70% ISPRP (MISCELLANEOUS) ×1
BAG TISS RTRVL C235 10X14 (MISCELLANEOUS) ×1
BLADE SURG SZ11 CARB STEEL (BLADE) ×2 IMPLANT
CANISTER SUCT 1200ML W/VALVE (MISCELLANEOUS) ×2 IMPLANT
CANNULA REDUC XI 12-8 STAPL (CANNULA) ×1
CANNULA REDUCER 12-8 DVNC XI (CANNULA) ×1 IMPLANT
CHLORAPREP W/TINT 26 (MISCELLANEOUS) ×2 IMPLANT
CLIP VESOLOCK MED LG 6/CT (CLIP) ×2 IMPLANT
COVER TIP SHEARS 8 DVNC (MISCELLANEOUS) ×1 IMPLANT
COVER TIP SHEARS 8MM DA VINCI (MISCELLANEOUS) ×1
COVER WAND RF STERILE (DRAPES) ×2 IMPLANT
DECANTER SPIKE VIAL GLASS SM (MISCELLANEOUS) ×4 IMPLANT
DEFOGGER SCOPE WARMER CLEARIFY (MISCELLANEOUS) ×2 IMPLANT
DERMABOND ADVANCED (GAUZE/BANDAGES/DRESSINGS) ×1
DERMABOND ADVANCED .7 DNX12 (GAUZE/BANDAGES/DRESSINGS) ×1 IMPLANT
DRAPE 3/4 80X56 (DRAPES) ×2 IMPLANT
DRAPE ARM DVNC X/XI (DISPOSABLE) ×4 IMPLANT
DRAPE COLUMN DVNC XI (DISPOSABLE) ×1 IMPLANT
DRAPE DA VINCI XI ARM (DISPOSABLE) ×4
DRAPE DA VINCI XI COLUMN (DISPOSABLE) ×1
ELECT CAUTERY BLADE 6.4 (BLADE) ×2 IMPLANT
ELECT REM PT RETURN 9FT ADLT (ELECTROSURGICAL) ×2
ELECTRODE REM PT RTRN 9FT ADLT (ELECTROSURGICAL) ×1 IMPLANT
GLOVE BIOGEL PI IND STRL 7.0 (GLOVE) ×2 IMPLANT
GLOVE BIOGEL PI INDICATOR 7.0 (GLOVE) ×2
GLOVE SURG SYN 6.5 ES PF (GLOVE) ×4 IMPLANT
GLOVE SURG SYN 6.5 PF PI (GLOVE) ×2 IMPLANT
GOWN STRL REUS W/ TWL LRG LVL3 (GOWN DISPOSABLE) ×3 IMPLANT
GOWN STRL REUS W/TWL LRG LVL3 (GOWN DISPOSABLE) ×6
GRASPER SUT TROCAR 14GX15 (MISCELLANEOUS) ×2 IMPLANT
HEMOSTAT SURGICEL 2X3 (HEMOSTASIS) ×1 IMPLANT
IRRIGATOR SUCT 8 DISP DVNC XI (IRRIGATION / IRRIGATOR) ×1 IMPLANT
IRRIGATOR SUCTION 8MM XI DISP (IRRIGATION / IRRIGATOR)
IV NS 1000ML (IV SOLUTION)
IV NS 1000ML BAXH (IV SOLUTION) IMPLANT
KIT IMAGING PINPOINTPAQ (MISCELLANEOUS) ×1 IMPLANT
KIT PINK PAD W/HEAD ARE REST (MISCELLANEOUS) ×2
KIT PINK PAD W/HEAD ARM REST (MISCELLANEOUS) ×1 IMPLANT
LABEL OR SOLS (LABEL) ×2 IMPLANT
NEEDLE HYPO 22GX1.5 SAFETY (NEEDLE) ×2 IMPLANT
NEEDLE VERESS 14GA 120MM (NEEDLE) ×2 IMPLANT
NS IRRIG 500ML POUR BTL (IV SOLUTION) ×2 IMPLANT
OBTURATOR OPTICAL STANDARD 8MM (TROCAR) ×1
OBTURATOR OPTICAL STND 8 DVNC (TROCAR) ×1
OBTURATOR OPTICALSTD 8 DVNC (TROCAR) ×1 IMPLANT
PACK LAP CHOLECYSTECTOMY (MISCELLANEOUS) ×2 IMPLANT
PENCIL ELECTRO HAND CTR (MISCELLANEOUS) ×2 IMPLANT
SEAL CANN UNIV 5-8 DVNC XI (MISCELLANEOUS) ×3 IMPLANT
SEAL XI 5MM-8MM UNIVERSAL (MISCELLANEOUS) ×3
SOLUTION ELECTROLUBE (MISCELLANEOUS) ×2 IMPLANT
STAPLER CANNULA SEAL DVNC XI (STAPLE) ×1 IMPLANT
STAPLER CANNULA SEAL XI (STAPLE) ×1
SUT MNCRL 4-0 (SUTURE) ×2
SUT MNCRL 4-0 27XMFL (SUTURE) ×1
SUT VIC AB 3-0 SH 27 (SUTURE) ×2
SUT VIC AB 3-0 SH 27X BRD (SUTURE) ×1 IMPLANT
SUT VICRYL 0 AB UR-6 (SUTURE) ×2 IMPLANT
SUTURE MNCRL 4-0 27XMF (SUTURE) ×1 IMPLANT
SYR 20ML LL LF (SYRINGE) ×2 IMPLANT
TROCAR XCEL NON-BLD 5MMX100MML (ENDOMECHANICALS) ×2 IMPLANT
TUBING EVAC SMOKE HEATED PNEUM (TUBING) ×2 IMPLANT

## 2019-06-06 NOTE — Anesthesia Preprocedure Evaluation (Signed)
Anesthesia Evaluation  Patient identified by MRN, date of birth, ID band Patient awake    Reviewed: Allergy & Precautions, H&P , NPO status , Patient's Chart, lab work & pertinent test results  History of Anesthesia Complications Negative for: history of anesthetic complications  Airway Mallampati: II  TM Distance: >3 FB Neck ROM: full    Dental  (+) Chipped   Pulmonary neg pulmonary ROS, neg shortness of breath,           Cardiovascular Exercise Tolerance: Good negative cardio ROS       Neuro/Psych negative neurological ROS  negative psych ROS   GI/Hepatic negative GI ROS, Neg liver ROS,   Endo/Other  negative endocrine ROS  Renal/GU      Musculoskeletal   Abdominal   Peds  Hematology negative hematology ROS (+)   Anesthesia Other Findings Past Medical History: No date: Healthy adolescent  Past Surgical History: No date: NO PAST SURGERIES  BMI    Body Mass Index: 21.46 kg/m      Reproductive/Obstetrics negative OB ROS                             Anesthesia Physical Anesthesia Plan  ASA: I  Anesthesia Plan: General ETT   Post-op Pain Management:    Induction: Intravenous  PONV Risk Score and Plan: Ondansetron, Dexamethasone, Midazolam and Treatment may vary due to age or medical condition  Airway Management Planned: Oral ETT  Additional Equipment:   Intra-op Plan:   Post-operative Plan: Extubation in OR  Informed Consent: I have reviewed the patients History and Physical, chart, labs and discussed the procedure including the risks, benefits and alternatives for the proposed anesthesia with the patient or authorized representative who has indicated his/her understanding and acceptance.     Dental Advisory Given  Plan Discussed with: Anesthesiologist, CRNA and Surgeon  Anesthesia Plan Comments: (Patient consented for risks of anesthesia including but not  limited to:  - adverse reactions to medications - damage to teeth, lips or other oral mucosa - sore throat or hoarseness - Damage to heart, brain, lungs or loss of life  Patient voiced understanding.)        Anesthesia Quick Evaluation

## 2019-06-06 NOTE — Discharge Instructions (Signed)
Laparoscopic Cholecystectomy, Care After This sheet gives you information about how to care for yourself after your procedure. Your doctor may also give you more specific instructions. If you have problems or questions, contact your doctor. Follow these instructions at home: Care for cuts from surgery (incisions)   Follow instructions from your doctor about how to take care of your cuts from surgery. Make sure you: ? Wash your hands with soap and water before you change your bandage (dressing). If you cannot use soap and water, use hand sanitizer. ? Change your bandage as told by your doctor. ? Leave stitches (sutures), skin glue, or skin tape (adhesive) strips in place. They may need to stay in place for 2 weeks or longer. If tape strips get loose and curl up, you may trim the loose edges. Do not remove tape strips completely unless your doctor says it is okay.  Do not take baths, swim, or use a hot tub until your doctor says it is okay. OK TO SHOWER 24HRS AFTER YOUR SURGERY.   Check your surgical cut area every day for signs of infection. Check for: ? More redness, swelling, or pain. ? More fluid or blood. ? Warmth. ? Pus or a bad smell. Activity  Do not drive or use heavy machinery while taking prescription pain medicine.  Do not play contact sports until your doctor says it is okay.  Do not drive for 24 hours if you were given a medicine to help you relax (sedative).  Rest as needed. Do not return to work or school until your doctor says it is okay. General instructions .  tylenol and advil as needed for discomfort.  Please alternate between the two every four hours as needed for pain.   .  Use narcotics, if prescribed, only when tylenol and motrin is not enough to control pain. .  325-650mg every 8hrs to max of 3000mg/24hrs (including the 325mg in every norco dose) for the tylenol.   .  Advil up to 800mg per dose every 8hrs as needed for pain.    To prevent or treat constipation  while you are taking prescription pain medicine, your doctor may recommend that you: ? Drink enough fluid to keep your pee (urine) clear or pale yellow. ? Take over-the-counter or prescription medicines. ? Eat foods that are high in fiber, such as fresh fruits and vegetables, whole grains, and beans. ? Limit foods that are high in fat and processed sugars, such as fried and sweet foods. Contact a doctor if:  You develop a rash.  You have more redness, swelling, or pain around your surgical cuts.  You have more fluid or blood coming from your surgical cuts.  Your surgical cuts feel warm to the touch.  You have pus or a bad smell coming from your surgical cuts.  You have a fever.  One or more of your surgical cuts breaks open. Get help right away if:  You have trouble breathing.  You have chest pain.  You have pain that is getting worse in your shoulders.  You faint or feel dizzy when you stand.  You have very bad pain in your belly (abdomen).  You are sick to your stomach (nauseous) for more than one day.  You have throwing up (vomiting) that lasts for more than one day.  You have leg pain. This information is not intended to replace advice given to you by your health care provider. Make sure you discuss any questions you have with your   health care provider. Document Released: 06/10/2008 Document Revised: 03/22/2016 Document Reviewed: 02/18/2016 Elsevier Interactive Patient Education  2019 [employer]   AMBULATORY SURGERY  DISCHARGE INSTRUCTIONS   1) The drugs that you were given will stay in your system until tomorrow so for the next 24 hours you should not:  A) Drive an automobile B) Make any legal decisions C) Drink any alcoholic beverage   2) You may resume regular meals tomorrow.  Today it is better to start with liquids and gradually work up to solid foods.  You may eat anything you prefer, but it is better to start with liquids, then soup and crackers,  and gradually work up to solid foods.   3) Please notify your doctor immediately if you have any unusual bleeding, trouble breathing, redness and pain at the surgery site, drainage, fever, or pain not relieved by medication.    4) Additional Instructions:        Please contact your physician with any problems or Same Day Surgery at [phone], Monday through Friday 6 am to 4 pm, or [place] [facility] [place] Main number at [phone]. 

## 2019-06-06 NOTE — Transfer of Care (Signed)
Immediate Anesthesia Transfer of Care Note  Patient: Olivia Ortiz  Procedure(s) Performed: XI ROBOTIC ASSISTED LAPAROSCOPIC CHOLECYSTECTOMY (N/A ) INDOCYANINE GREEN FLUORESCENCE IMAGING (ICG) (N/A )  Patient Location: PACU  Anesthesia Type:General  Level of Consciousness: drowsy  Airway & Oxygen Therapy: Patient Spontanous Breathing and Patient connected to face mask oxygen  Post-op Assessment: Report given to RN and Post -op Vital signs reviewed and stable  Post vital signs: Reviewed and stable  Last Vitals:  Vitals Value Taken Time  BP 107/54 06/06/19 1618  Temp 36 C 06/06/19 1618  Pulse 84 06/06/19 1627  Resp 15 06/06/19 1627  SpO2 100 % 06/06/19 1627  Vitals shown include unvalidated device data.  Last Pain:  Vitals:   06/06/19 1618  TempSrc:   PainSc: Asleep         Complications: No apparent anesthesia complications

## 2019-06-06 NOTE — Anesthesia Post-op Follow-up Note (Signed)
Anesthesia QCDR form completed.        

## 2019-06-06 NOTE — Op Note (Signed)
Preoperative diagnosis:  biliary colic  Postoperative diagnosis: same as above  Procedure: Robotic assisted Laparoscopic Cholecystectomy.   Anesthesia: GETA   Surgeon: Sung AmabileIsami Shakeisha Horine  Specimen: Gallbladder  Complications: None  EBL: 5mL  Wound Classification: Clean Contaminated  Indications: see HPI  Findings: Critical view of safety noted Cystic duct and artery identified, ligated and divided, clips remained intact at end of procedure Adequate hemostasis  Description of procedure: The patient was placed on the operating table in the supine position. SCDs placed, pre-op abx administered.  General anesthesia was induced and OG tube placed by anesthesia. A time-out was completed verifying correct patient, procedure, site, positioning, and implant(s) and/or special equipment prior to beginning this procedure. The abdomen was prepped and draped in the usual sterile fashion.    Veress needle was placed at the Palmer's point and insufflation was started after confirming a positive saline drop test and no immediate increase in abdominal pressure.  After reaching 15 mm, the Veress needle was removed and a 5 mm Optiview port was placed through the suprapubic port site 16mm from GB.  The abdomen was inspected and no abnormalities or injuries were found.  Under direct vision, ports were placed in the following locations: One 12 mm port at the suprapubic region, replacing the optiview site, two 8 mm ports placed to the patient right of the 12mm port 6 cm apart due to patient body habitus.  1 additional 8 mm port placed 8 cm to the patient left.  Once ports were placed, the Xi platform was brought into the operative field and docked to the ports successfully.  An endoscope was placed through the umbilical port, prograsp through the adjacent patient right port, fenestrated forceps to the far patient left port, and then a hook cautery and the patient right port.   The table was placed in the reverse  Trendelenburg position with the right side up.  The dome of the gallbladder was grasped with prograsp, passed and retracted over the dome of the liver. Adhesions between the gallbladder and omentum, duodenum and transverse colon were lysed via hook cautery. The infundibulum was grasped with the fenestrated grasper and retracted toward the right lower quadrant. This maneuver exposed Calot's triangle. The peritoneum overlying the gallbladder infundibulum was then dissected using combination of [place] dissector and electrocautery hook and the cystic duct and cystic artery identified.  Critical view of safety with the liver bed clearly visible behind the duct and artery with no additional structures noted.  The cystic duct and cystic artery clipped and divided close to the gallbladder.  2 robotic clips were placed at the base of the cystic duct, one clip for the cystic artery.   The gallbladder was then dissected from its peritoneal and liver bed attachments by electrocautery. Hemostasis was checked prior to removing the pro-grasp retractor and placing the endoscope through the port it was in.  The Endo Catch bag was then placed through the 12 mm port site at the umbilicus and the gallbladder was removed.  The gallbladder was passed off the table as a specimen. There was no evidence of bleeding from the gallbladder fossa or cystic artery or leakage of the bile from the cystic duct stump. Abdomen desufflated and secondary trocars were removed under direct vision. No bleeding was noted.  The suprapubic port site closed with  0 vicryl. 3-0 vicryl used to close deep dermal layer at suprapubic site.  All skin incisions then closed with subcuticular sutures of 4-0 monocryl and dressed with  topical skin adhesive. The orogastric tube was removed and patient extubated. The patient tolerated the procedure well and was taken to the postanesthesia care unit in stable condition.  All sponge and instrument count correct at end  of procedure.

## 2019-06-06 NOTE — Interval H&P Note (Signed)
History and Physical Interval Note:  06/06/2019 12:43 PM  Olivia Ortiz  has presented today for surgery, with the diagnosis of M41.58 Biliary Colic.  The various methods of treatment have been discussed with the patient and family. After consideration of risks, benefits and other options for treatment, the patient has consented to  Procedure(s): XI ROBOTIC ASSISTED LAPAROSCOPIC CHOLECYSTECTOMY (N/A) [place] (ICG) (N/A) as a surgical intervention.  The patient's history has been reviewed, patient examined, no change in status, stable for surgery.  I have reviewed the patient's chart and labs.  Questions were answered to the patient's satisfaction.     Olivia Ortiz

## 2019-06-06 NOTE — Anesthesia Procedure Notes (Signed)
Procedure Name: Intubation Performed by: Fredderick Phenix, CRNA Pre-anesthesia Checklist: Patient identified, Emergency Drugs available, Suction available and Patient being monitored Patient Re-evaluated:Patient Re-evaluated prior to induction Oxygen Delivery Method: Circle system utilized Preoxygenation: Pre-oxygenation with 100% oxygen Induction Type: IV induction Ventilation: Mask ventilation without difficulty Tube type: Oral Tube size: 7.0 mm Number of attempts: 1 Airway Equipment and Method: Stylet and Oral airway Placement Confirmation: ETT inserted through vocal cords under direct vision,  positive ETCO2 and breath sounds checked- equal and bilateral Tube secured with: Tape Dental Injury: Teeth and Oropharynx as per pre-operative assessment

## 2019-06-07 NOTE — Anesthesia Postprocedure Evaluation (Signed)
Anesthesia Post Note  Patient: Forensic psychologist  Procedure(s) Performed: XI ROBOTIC ASSISTED LAPAROSCOPIC CHOLECYSTECTOMY (N/A ) INDOCYANINE GREEN FLUORESCENCE IMAGING (ICG) (N/A )  Patient location during evaluation: PACU Anesthesia Type: General Level of consciousness: awake and alert Pain management: pain level controlled Vital Signs Assessment: post-procedure vital signs reviewed and stable Respiratory status: spontaneous breathing, nonlabored ventilation and respiratory function stable Cardiovascular status: blood pressure returned to baseline and stable Postop Assessment: no apparent nausea or vomiting Anesthetic complications: no     Last Vitals:  Vitals:   06/06/19 1733 06/06/19 1743  BP: 111/60 114/66  Pulse: 80 90  Resp: 14 18  Temp: (!) 36.4 C (!) 36.1 C  SpO2: 100% 95%    Last Pain:  Vitals:   06/06/19 1743  TempSrc: Temporal  PainSc: 0-No pain                 Durenda Hurt

## 2019-06-08 LAB — SURGICAL PATHOLOGY

## 2019-09-21 ENCOUNTER — Ambulatory Visit: Payer: No Typology Code available for payment source | Attending: Internal Medicine

## 2019-09-21 DIAGNOSIS — Z20822 Contact with and (suspected) exposure to covid-19: Secondary | ICD-10-CM

## 2019-09-23 LAB — NOVEL CORONAVIRUS, NAA: SARS-CoV-2, NAA: DETECTED — AB

## 2020-04-25 ENCOUNTER — Ambulatory Visit: Payer: Self-pay | Admitting: Nurse Practitioner

## 2020-04-25 NOTE — Telephone Encounter (Signed)
Pt reports stepped on rusty nail yesterday. Right foot with puncture wound, "Maybe 1/2 in." Denies any redness or drainage. States looks closed, just like a bruise. Pt's last Tdap April 2021, due 4/22.  Home care advise given, pt verbalizes understanding.  Reason for Disposition  Minor puncture wound  Answer Assessment - Initial Assessment Questions 1. LOCATION: "Where is the puncture located?"      Left foot, between great toe and 2nd toe 2. OBJECT: "What was the object that punctured the skin?"      Nail, rusty 3. DEPTH: "How deep do you think the puncture goes?"      Unsure Maybe 1/2 in. 4. ONSET: "When did the injury occur?" (Minutes or hours)     yesterday 5. PAIN: "Is it painful?" If Yes, ask: "How bad is the pain?"  (Scale 1-10; or mild, moderate, severe)     4/10 6. TETANUS: "When was the last tetanus booster?" Due in April 2022  Protocols used: PUNCTURE WOUND-A-AH

## 2020-04-26 ENCOUNTER — Encounter: Payer: Self-pay | Admitting: Nurse Practitioner

## 2020-12-20 IMAGING — US US ABDOMEN LIMITED
1 series · 14 of 25 positions shown · non-contrast
Comparison: None.

CLINICAL DATA: Right upper quadrant pain for 1 day

EXAM:
ULTRASOUND ABDOMEN LIMITED RIGHT UPPER QUADRANT

[Series 1: us abdomen limited · 14 of 79 slices shown]
[im 1/79]
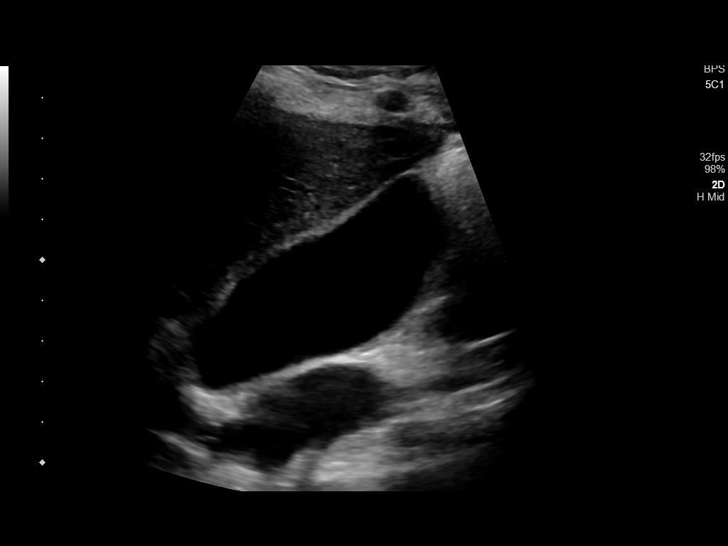
[im 7/79]
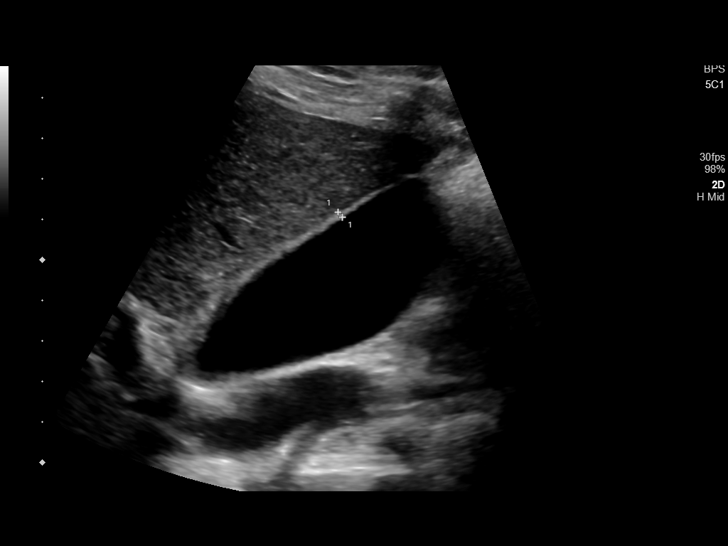
[im 14/79]
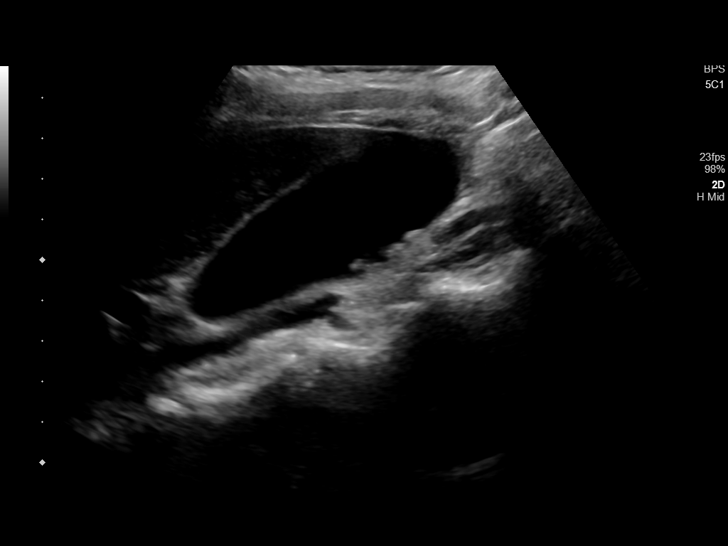
[im 20/79]
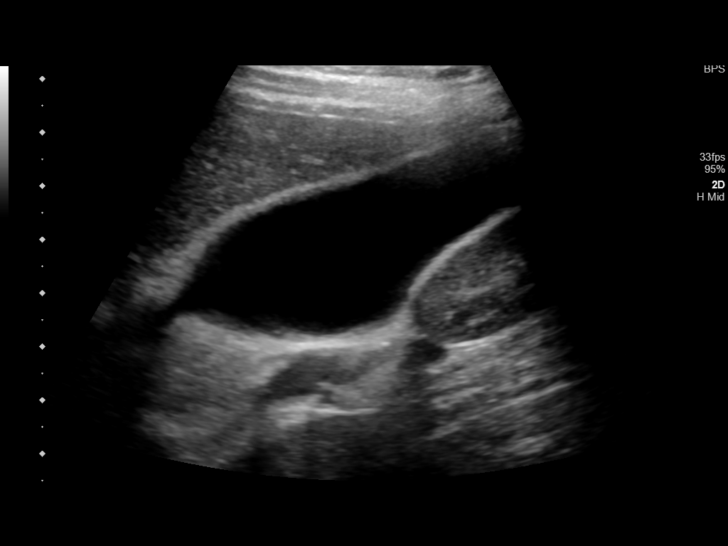
[im 27/79]
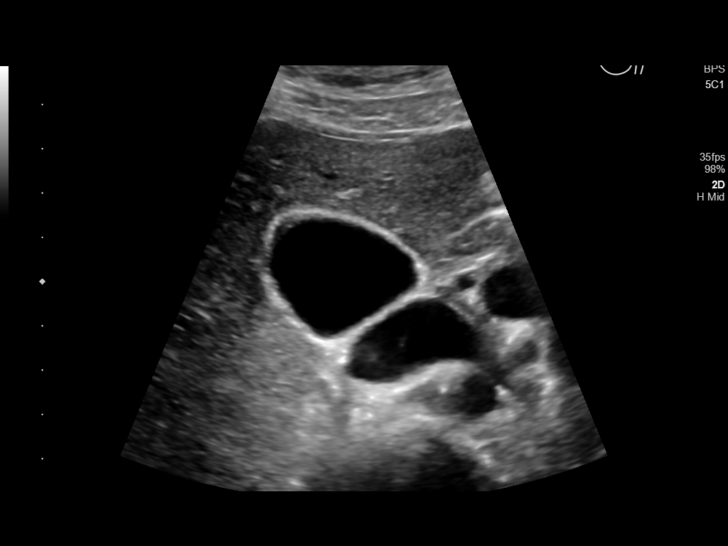
[im 30/79]
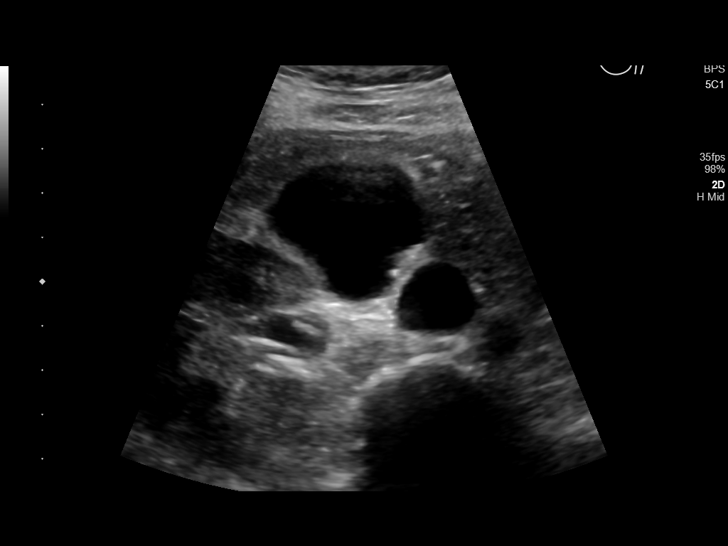
[im 36/79]
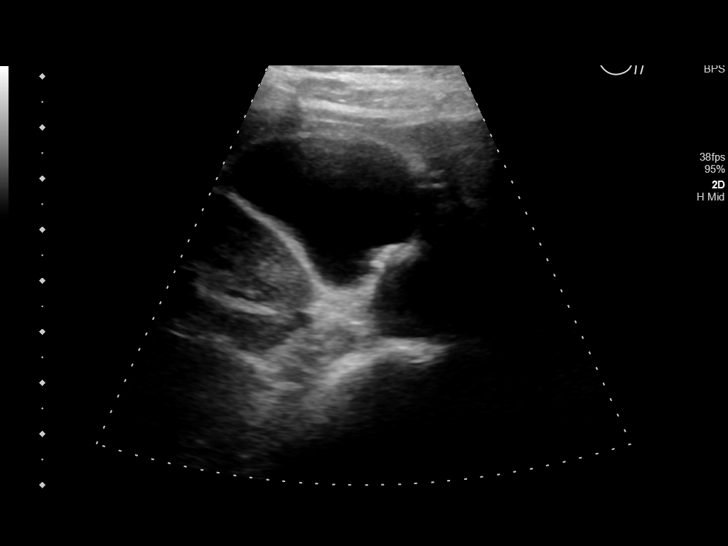
[im 43/79]
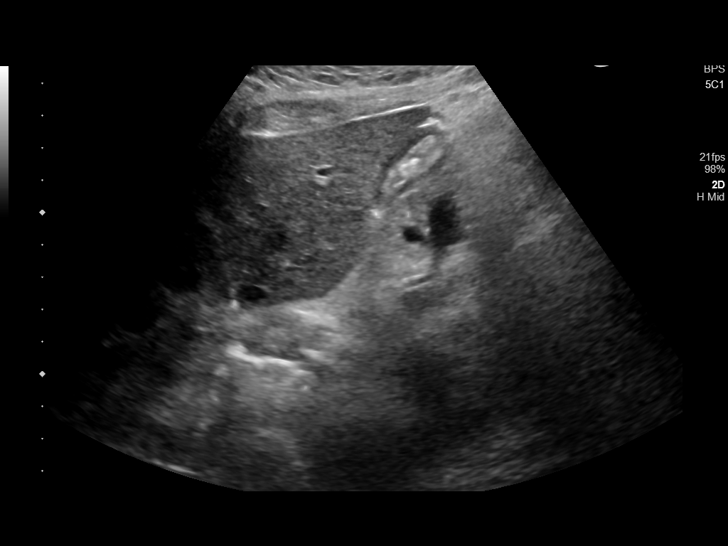
[im 49/79]
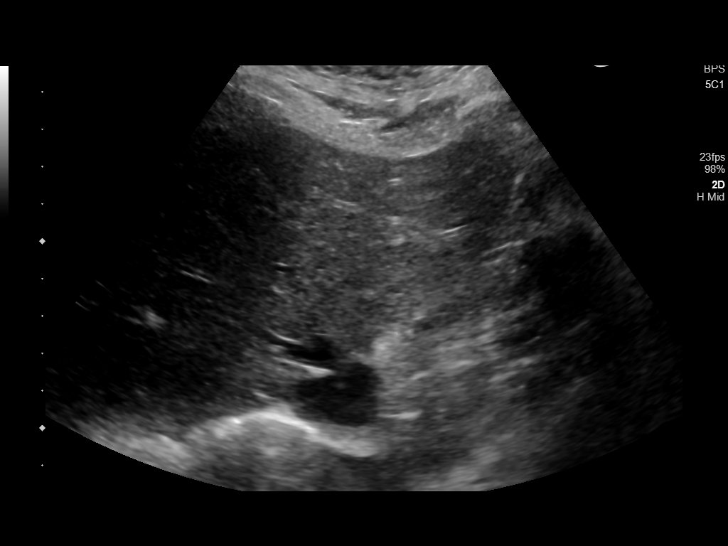
[im 53/79]
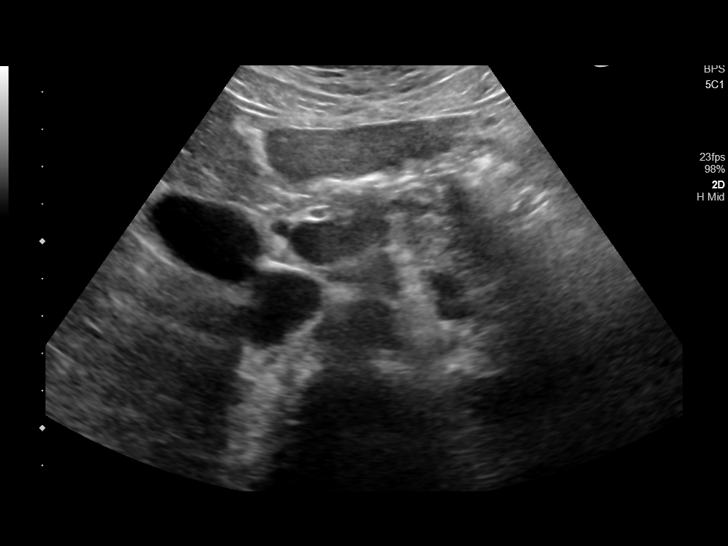
[im 59/79]
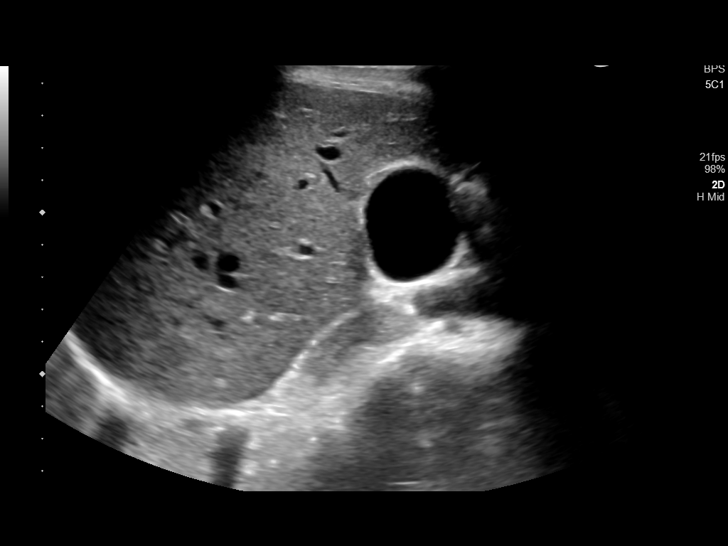
[im 66/79]
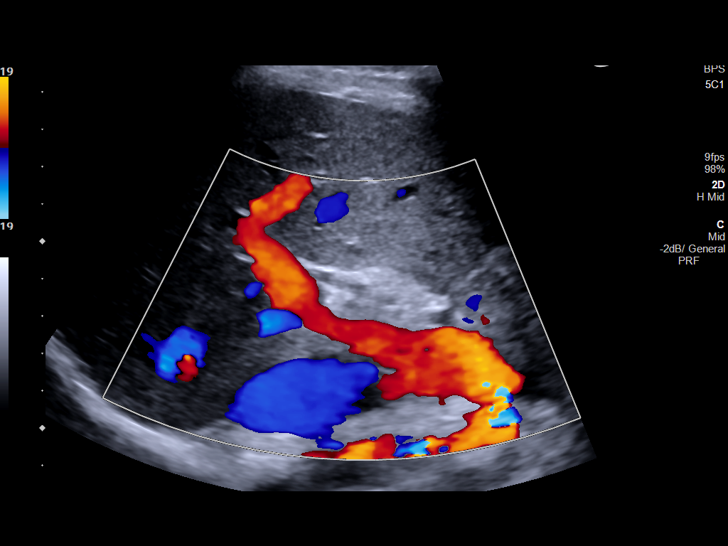
[im 72/79]
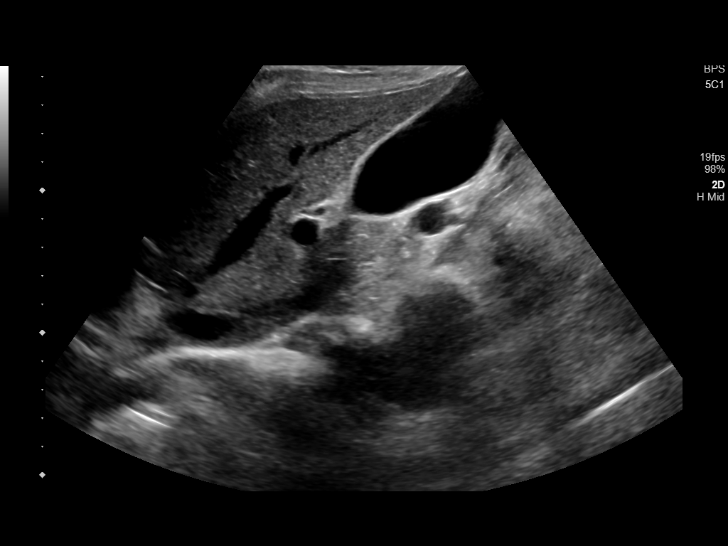
[im 79/79]
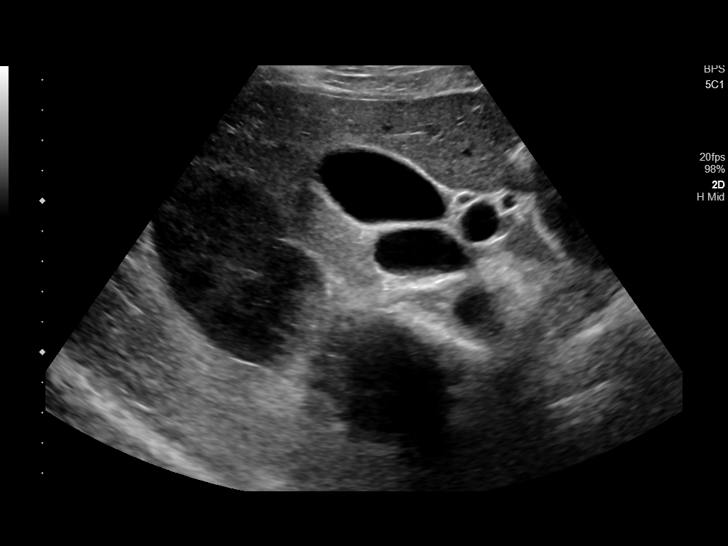

[14 of 25 positions shown; findings below may reference images not displayed]

FINDINGS: Gallbladder:

There are several small gallstones measuring up to 6 mm. No
gallbladder wall thickening. No pericholecystic fluid. No
sonographic Murphy sign noted by sonographer.

Common bile duct:

Diameter: 2.0 mm

Liver:

No focal lesion identified. Within normal limits in parenchymal
echogenicity. Portal vein is patent on color Doppler imaging with
normal direction of blood flow towards the liver.

Other: None.
IMPRESSION: Cholelithiasis without other evidence of cholecystitis.

## 2022-12-09 ENCOUNTER — Ambulatory Visit: Payer: Managed Care, Other (non HMO) | Admitting: Gastroenterology

## 2022-12-09 ENCOUNTER — Encounter: Payer: Self-pay | Admitting: Gastroenterology

## 2022-12-09 VITALS — BP 135/76 | HR 125 | Temp 98.9°F | Ht 64.0 in | Wt 159.2 lb

## 2022-12-09 DIAGNOSIS — R1319 Other dysphagia: Secondary | ICD-10-CM | POA: Diagnosis not present

## 2022-12-09 MED ORDER — OMEPRAZOLE 20 MG PO CPDR: 20.0000 mg | DELAYED_RELEASE_CAPSULE | Freq: Two times a day (BID) | ORAL | 2 refills | Status: DC

## 2022-12-09 NOTE — Addendum Note (Signed)
Addended by: Jacqualin Combes on: 12/09/2022 03:35 PM   Modules accepted: Orders

## 2022-12-09 NOTE — Progress Notes (Signed)
Jonathon Bellows MD, MRCP(U.K) [address]  [place]  [place], [place] [address]  Main: [phone]  Fax: [phone]   Gastroenterology Consultation  Referring Provider:     Beverly Gust, MD Primary Care Physician:  Mikey College, NP (Inactive) Primary Gastroenterologist:  Dr. Jonathon Bellows  Reason for Consultation:     Dysphagia         HPI:   Olivia Ortiz is a 22 y.o. y/o female referred for consultation & management  by  Mikey College, NP (Inactive).    She has been referred to see me for dysphagia.  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 She has been having difficulty swallowing since the age of 58.  She has had an episode when she was 22 years old where the food was stuck in her esophagus had to go into the ER but she coughed it off before requiring endoscopy.  No family history of dysphagia no family history of allergies no personal allergies.  Occasional heartburn presently has difficulty mostly for solids during swallowing and occasionally liquids occurs 2-3 times a week never had any endoscopic evaluation.  Past Medical History:  Diagnosis Date   Healthy adolescent     Past Surgical History:  Procedure Laterality Date   NO PAST SURGERIES      Prior to Admission medications   Medication Sig Start Date End Date Taking? Authorizing Provider  ibuprofen (ADVIL) 800 MG tablet Take 1 tablet (800 mg total) by mouth every 8 (eight) hours as needed for mild pain or moderate pain. 06/06/19   Benjamine Sprague, DO    Family History  Problem Relation Age of Onset   Healthy Mother    Healthy Father    Cancer Paternal Grandmother    Breast cancer Paternal Grandmother    Healthy Brother    Diabetes Maternal Grandfather    CAD Maternal Grandfather    Cancer Paternal Grandfather        skin   Stroke Neg Hx      Social History   Tobacco Use   Smoking status: Never   Smokeless tobacco: Never  Vaping Use   Vaping Use: Never used   Substance Use Topics   Alcohol use: No   Drug use: No    Allergies as of 12/09/2022   (No Known Allergies)    Review of Systems:    All systems reviewed and negative except where noted in HPI.   Physical Exam:  BP 135/76   Pulse (!) 125   Temp 98.9 F (37.2 C) (Oral)   Ht 5\' 4"  (1.626 m)   Wt 159 lb 4 oz (72.2 kg)   BMI 27.34 kg/m  No LMP recorded. Patient has had an injection. Psych:  Alert and cooperative. Normal mood and affect. General:   Alert,  Well-developed, well-nourished, pleasant and cooperative in NAD Head:  Normocephalic and atraumatic. Eyes:  Sclera clear, no icterus.   Conjunctiva pink. Ears:  Normal auditory acuity.   Neurologic:  Alert and oriented x3;  grossly normal neurologically. Psych:  Alert and cooperative. Normal mood and affect.  Imaging Studies: No results found.  Assessment and Plan:   Olivia Ortiz is a 22 y.o. y/o female has been referred for dysphagia.  Longstanding will need to rule out eosinophilic esophagitis and if negative will rule out achalasia  Plan 1.  EGD and biopsies on Thursday 2.  Commence on Prilosec 20 mg twice daily   I have discussed alternative options, risks &  benefits,  which include, but are not limited to, bleeding, infection, perforation,respiratory complication & drug reaction.  The patient agrees with this plan & written consent will be obtained.      Follow up in 3 months   Dr Jonathon Bellows MD,MRCP(U.K)

## 2022-12-10 NOTE — Anesthesia Preprocedure Evaluation (Signed)
Anesthesia Evaluation  Patient identified by MRN, date of birth, ID band Patient awake    Reviewed: Allergy & Precautions, H&P , NPO status , Patient's Chart, lab work & pertinent test results  Airway Mallampati: II  TM Distance: >3 FB Neck ROM: full    Dental no notable dental hx.    Pulmonary neg pulmonary ROS   Pulmonary exam normal        Cardiovascular negative cardio ROS Normal cardiovascular exam     Neuro/Psych negative neurological ROS  negative psych ROS   GI/Hepatic negative GI ROS, Neg liver ROS,,,  Endo/Other  negative endocrine ROS    Renal/GU negative Renal ROS  negative genitourinary   Musculoskeletal   Abdominal Normal abdominal exam  (+)   Peds  Hematology negative hematology ROS (+)   Anesthesia Other Findings Past Medical History: No date: Healthy adolescent   Reproductive/Obstetrics negative OB ROS                             Anesthesia Physical Anesthesia Plan  ASA: 1  Anesthesia Plan: General   Post-op Pain Management:    Induction:   PONV Risk Score and Plan: Propofol infusion and TIVA  Airway Management Planned: Natural Airway  Additional Equipment:   Intra-op Plan:   Post-operative Plan:   Informed Consent: I have reviewed the patients History and Physical, chart, labs and discussed the procedure including the risks, benefits and alternatives for the proposed anesthesia with the patient or authorized representative who has indicated his/her understanding and acceptance.     Dental Advisory Given  Plan Discussed with: CRNA and Surgeon  Anesthesia Plan Comments:         Anesthesia Quick Evaluation

## 2022-12-11 ENCOUNTER — Ambulatory Visit: Payer: Managed Care, Other (non HMO) | Admitting: Anesthesiology

## 2022-12-11 ENCOUNTER — Encounter: Payer: Self-pay | Admitting: Gastroenterology

## 2022-12-11 ENCOUNTER — Ambulatory Visit
Admission: RE | Admit: 2022-12-11 | Discharge: 2022-12-11 | Disposition: A | Discharge status: Home / Self Care | Payer: Managed Care, Other (non HMO) | Attending: Gastroenterology | Admitting: Gastroenterology

## 2022-12-11 ENCOUNTER — Encounter
Admission: RE | Disposition: A | Discharge status: Home / Self Care | Payer: Self-pay | Source: Home / Self Care | Attending: Gastroenterology

## 2022-12-11 DIAGNOSIS — K222 Esophageal obstruction: Secondary | ICD-10-CM | POA: Diagnosis not present

## 2022-12-11 DIAGNOSIS — R1319 Other dysphagia: Secondary | ICD-10-CM

## 2022-12-11 DIAGNOSIS — R1310 Dysphagia, unspecified: Secondary | ICD-10-CM | POA: Diagnosis present

## 2022-12-11 HISTORY — PX: ESOPHAGOGASTRODUODENOSCOPY (EGD) WITH PROPOFOL: SHX5813

## 2022-12-11 LAB — POCT PREGNANCY, URINE: Preg Test, Ur: NEGATIVE

## 2022-12-11 SURGERY — ESOPHAGOGASTRODUODENOSCOPY (EGD) WITH PROPOFOL
Anesthesia: General

## 2022-12-11 MED ORDER — PROPOFOL 10 MG/ML IV BOLUS
INTRAVENOUS | Status: AC
  Filled 2022-12-11: qty 20

## 2022-12-11 MED ORDER — LIDOCAINE HCL (CARDIAC) PF 100 MG/5ML IV SOSY
PREFILLED_SYRINGE | INTRAVENOUS | Status: DC | PRN
  Administered 2022-12-11: 100 mg via INTRAVENOUS

## 2022-12-11 MED ORDER — PROPOFOL 10 MG/ML IV BOLUS
INTRAVENOUS | Status: DC | PRN
  Administered 2022-12-11: 30 mg via INTRAVENOUS
  Administered 2022-12-11: 120 mg via INTRAVENOUS
  Administered 2022-12-11: 20 mg via INTRAVENOUS
  Administered 2022-12-11 (×2): 30 mg via INTRAVENOUS

## 2022-12-11 MED ORDER — LIDOCAINE HCL (PF) 2 % IJ SOLN
INTRAMUSCULAR | Status: AC
  Filled 2022-12-11: qty 5

## 2022-12-11 MED ORDER — SODIUM CHLORIDE 0.9 % IV SOLN: INTRAVENOUS | Status: DC

## 2022-12-11 NOTE — Anesthesia Postprocedure Evaluation (Signed)
Anesthesia Post Note  Patient: Forensic psychologist  Procedure(s) Performed: ESOPHAGOGASTRODUODENOSCOPY (EGD) WITH PROPOFOL  Patient location during evaluation: Endoscopy Anesthesia Type: General Level of consciousness: awake and alert Pain management: pain level controlled Vital Signs Assessment: post-procedure vital signs reviewed and stable Respiratory status: spontaneous breathing, nonlabored ventilation and respiratory function stable Cardiovascular status: blood pressure returned to baseline and stable Postop Assessment: no apparent nausea or vomiting Anesthetic complications: no   No notable events documented.   Last Vitals:  Vitals:   12/11/22 1134 12/11/22 1148  BP: 122/86 118/81  Pulse: 68 71  Resp: 15 (!) 21  Temp:    SpO2: 94% 98%    Last Pain:  Vitals:   12/11/22 1148  TempSrc:   PainSc: 0-No pain                 [place] Ouch

## 2022-12-11 NOTE — Transfer of Care (Signed)
Immediate Anesthesia Transfer of Care Note  Patient: Olivia Ortiz  Procedure(s) Performed: ESOPHAGOGASTRODUODENOSCOPY (EGD) WITH PROPOFOL  Patient Location: PACU  Anesthesia Type:General  Level of Consciousness: drowsy  Airway & Oxygen Therapy: Patient Spontanous Breathing  Post-op Assessment: Report given to RN and Post -op Vital signs reviewed and stable  Post vital signs: Reviewed and stable  Last Vitals:  Vitals Value Taken Time  BP 91/49 12/11/22 1114  Temp 35.9 C 12/11/22 1114  Pulse 85 12/11/22 1114  Resp 16 12/11/22 1114  SpO2 95 % 12/11/22 1114    Last Pain:  Vitals:   12/11/22 1114  TempSrc: Temporal  PainSc: Asleep         Complications: No notable events documented.

## 2022-12-11 NOTE — Op Note (Signed)
[facility] Gastroenterology Patient Name: Olivia Ortiz Procedure Date: 12/11/2022 10:52 AM MRN: [id number] Account #: [id number] Date of Birth: [birth year] Admit Type: Outpatient Age: 22 Room: [facility] ENDO ROOM 1 Gender: Female Note Status: Finalized Instrument Name: Upper Endoscope [id number] Procedure:             Upper GI endoscopy Indications:           Dysphagia Providers:             Jonathon Bellows MD, MD Referring MD:          Mikey College (Referring MD) Medicines:             Monitored Anesthesia Care Complications:         No immediate complications. Procedure:             Pre-Anesthesia Assessment:                        - Prior to the procedure, a History and Physical was                         performed, and patient medications, allergies and                         sensitivities were reviewed. The patient's tolerance                         of previous anesthesia was reviewed.                        - The risks and benefits of the procedure and the                         sedation options and risks were discussed with the                         patient. All questions were answered and informed                         consent was obtained.                        - ASA Grade Assessment: II - A patient with mild                         systemic disease.                        - ASA Grade Assessment: II - A patient with mild                         systemic disease.                        After obtaining informed consent, the endoscope was                         passed under direct vision. Throughout the procedure,                         the patient's blood pressure, pulse,  and oxygen                         saturations were monitored continuously. The Endoscope                         was introduced through the mouth, and advanced to the                         third part of duodenum. The upper GI endoscopy was                         accomplished  with ease. The patient tolerated the                         procedure well. Findings:      The examined duodenum was normal.      The stomach was normal.      One benign-appearing, intrinsic moderate (circumferential scarring or       stenosis; an endoscope may pass) stenosis was found at the       gastroesophageal junction. This stenosis measured 1 cm (inner diameter).       The stenosis was traversed. Biopsies were taken with a cold forceps for       histology.      The cardia and gastric fundus were normal on retroflexion. Impression:            - Normal examined duodenum.                        - Normal stomach.                        - Benign-appearing esophageal stenosis. Biopsied. Recommendation:        - Discharge patient to home (with escort).                        - Resume previous diet.                        - Continue present medications.                        - Await pathology results.                        - Return to my office as previously scheduled. Procedure Code(s):     --- Professional ---                        [phone], Esophagogastroduodenoscopy, flexible,                         transoral; with biopsy, single or multiple Diagnosis Code(s):     --- Professional ---                        K22.2, Esophageal obstruction                        R13.10, Dysphagia, unspecified CPT copyright 2022 American Medical Association. All rights reserved. The codes documented in this report are preliminary and upon  coder review may  be revised to meet current compliance requirements. Jonathon Bellows, MD Jonathon Bellows MD, MD 12/11/2022 11:11:33 AM This report has been signed electronically. Number of Addenda: 0 Note Initiated On: 12/11/2022 10:52 AM Estimated Blood Loss:  Estimated blood loss: none.      [facility]

## 2022-12-11 NOTE — H&P (Signed)
     Jonathon Bellows, MD [address], [place], [place], [place], [address] [address], [place], [place], [place], [address] Phone: [phone]  Fax: [phone]  Primary Care Physician:  Mikey College, NP (Inactive)   Pre-Procedure History & Physical: HPI:  Olivia Ortiz is a 22 y.o. female is here for an endoscopy    Past Medical History:  Diagnosis Date   Healthy adolescent     Past Surgical History:  Procedure Laterality Date   CHOLECYSTECTOMY  2020   NO PAST SURGERIES      Prior to Admission medications   Medication Sig Start Date End Date Taking? Authorizing Provider  omeprazole (PRILOSEC) 20 MG capsule Take 1 capsule (20 mg total) by mouth 2 (two) times daily. 12/09/22   Jonathon Bellows, MD    Allergies as of 12/09/2022   (No Known Allergies)    Family History  Problem Relation Age of Onset   Healthy Mother    Healthy Father    Cancer Paternal Grandmother    Breast cancer Paternal Grandmother    Healthy Brother    Diabetes Maternal Grandfather    CAD Maternal Grandfather    Cancer Paternal Grandfather        skin   Stroke Neg Hx     Social History   Socioeconomic History   Marital status: Single    Spouse name: Not on file   Number of children: Not on file   Years of education: Not on file   Highest education level: Not on file  Occupational History   Occupation: student  Tobacco Use   Smoking status: Never   Smokeless tobacco: Never  Vaping Use   Vaping Use: Never used  Substance and Sexual Activity   Alcohol use: No   Drug use: No   Sexual activity: Not on file  Other Topics Concern   Not on file  Social History Narrative   Not on file   Social Determinants of Health   Financial Resource Strain: Not on file  Food Insecurity: Not on file  Transportation Needs: Not on file  Physical Activity: Not on file  Stress: Not on file  Social Connections: Not on file  Intimate Partner Violence: Not on file    Review of Systems: See  HPI, otherwise negative ROS  Physical Exam: BP 112/71   Pulse 86   Temp (!) 96.9 F (36.1 C) (Temporal)   Resp 16   Ht 5\' 4"  (1.626 m)   Wt 71.6 kg   SpO2 100%   BMI 27.09 kg/m  General:   Alert,  pleasant and cooperative in NAD Head:  Normocephalic and atraumatic. Neck:  Supple; no masses or thyromegaly. Lungs:  Clear throughout to auscultation, normal respiratory effort.    Heart:  +S1, +S2, Regular rate and rhythm, No edema. Abdomen:  Soft, nontender and nondistended. Normal bowel sounds, without guarding, and without rebound.   Neurologic:  Alert and  oriented x4;  grossly normal neurologically.  Impression/Plan: Olivia Ortiz is here for an endoscopy  to be performed for  evaluation of dysphagia    Risks, benefits, limitations, and alternatives regarding endoscopy have been reviewed with the patient.  Questions have been answered.  All parties agreeable.   Jonathon Bellows, MD  12/11/2022, 10:47 AM

## 2022-12-12 LAB — SURGICAL PATHOLOGY

## 2022-12-15 ENCOUNTER — Encounter: Payer: Self-pay | Admitting: Gastroenterology

## 2022-12-15 NOTE — Progress Notes (Signed)
1. She has EOE- inform give information  2. Needs a course of the new budesonide Eohilia 2 mg BID for 12 weeks 4. Omeprazole 40 mg once a day  5. Food allergy testing  6. Repeat EGD after 12 weeks to check for improvement .

## 2022-12-16 ENCOUNTER — Telehealth: Payer: Self-pay

## 2022-12-16 ENCOUNTER — Other Ambulatory Visit: Payer: Self-pay | Admitting: Gastroenterology

## 2022-12-16 DIAGNOSIS — K200 Eosinophilic esophagitis: Secondary | ICD-10-CM

## 2022-12-16 MED ORDER — EOHILIA 2 MG/10ML PO SUSP: 2.0000 mg | Freq: Two times a day (BID) | ORAL | 0 refills | Status: DC

## 2022-12-16 MED ORDER — OMEPRAZOLE 40 MG PO CPDR: 40.0000 mg | DELAYED_RELEASE_CAPSULE | Freq: Every day | ORAL | 3 refills | Status: AC

## 2022-12-16 NOTE — Telephone Encounter (Signed)
Needs 180 doses as it is BID for 12 weeks

## 2022-12-16 NOTE — Telephone Encounter (Signed)
Can we ask them what we need to treat with since this is the only FDA approved treatment for eosinophilic esophagitis . Can we call the company rep to find out as well.

## 2022-12-16 NOTE — Telephone Encounter (Signed)
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  3 mg budesonide tablets is not to be used for this condition.  We would need to do appeal

## 2022-12-16 NOTE — Telephone Encounter (Signed)
-----   Message from Jonathon Bellows, MD sent at 12/15/2022  8:59 AM EDT ----- 1. She has EOE- inform give information  2. Needs a course of the new budesonide Eohilia 2 mg BID for 12 weeks 4. Omeprazole 40 mg once a day  5. Food allergy testing  6. Repeat EGD after 12 weeks to check for improvement .

## 2022-12-16 NOTE — Telephone Encounter (Signed)
Called patient to give her the below information. Patient understood when I explained to her what EOE was and had no questions. I gave her the number to [facility] in [place], [place] so she could call them and ask for an appointment to go and have her labs drawn. She also agreed on taking the medications that will be sent to her pharmacy. Patient also stated that she would wait to schedule her EGD in 3 months. Therefore, I will put her in a recall list so she doesn't fall between the cracks. Patient had no further questions.

## 2022-12-23 ENCOUNTER — Encounter: Payer: Self-pay | Admitting: Gastroenterology

## 2022-12-23 ENCOUNTER — Telehealth: Payer: Self-pay

## 2022-12-23 NOTE — Telephone Encounter (Signed)
Patient called yesterday and left me a voicemail to call her back. I then called her this morning and had to leave her a voicemail to call me back.

## 2022-12-30 NOTE — Telephone Encounter (Signed)
Please read patient message from 12/23/2022.
